# Patient Record
Sex: Female | Born: 1975 | Race: Black or African American | Hispanic: No | Marital: Married | Smoking: Former smoker
Health system: Southern US, Community
[De-identification: ages and names within clinical notes are randomized; demographics above are authoritative.]

## PROBLEM LIST (undated history)

## (undated) DIAGNOSIS — K219 Gastro-esophageal reflux disease without esophagitis: Secondary | ICD-10-CM

## (undated) DIAGNOSIS — I10 Essential (primary) hypertension: Secondary | ICD-10-CM

## (undated) DIAGNOSIS — Z8669 Personal history of other diseases of the nervous system and sense organs: Secondary | ICD-10-CM

## (undated) HISTORY — PX: WISDOM TOOTH EXTRACTION: SHX21

## (undated) HISTORY — PX: LEG SURGERY: SHX1003

---

## 2021-05-08 ENCOUNTER — Ambulatory Visit: Payer: Self-pay

## 2022-05-20 ENCOUNTER — Emergency Department (HOSPITAL_BASED_OUTPATIENT_CLINIC_OR_DEPARTMENT_OTHER): Payer: Medicaid Other

## 2022-05-20 ENCOUNTER — Other Ambulatory Visit: Payer: Self-pay

## 2022-05-20 ENCOUNTER — Encounter (HOSPITAL_BASED_OUTPATIENT_CLINIC_OR_DEPARTMENT_OTHER): Payer: Self-pay | Admitting: Emergency Medicine

## 2022-05-20 ENCOUNTER — Emergency Department (HOSPITAL_BASED_OUTPATIENT_CLINIC_OR_DEPARTMENT_OTHER)
Admission: EM | Admit: 2022-05-20 | Discharge: 2022-05-21 | Disposition: A | Discharge status: Home / Self Care | Payer: Medicaid Other | Attending: Emergency Medicine | Admitting: Emergency Medicine

## 2022-05-20 DIAGNOSIS — S8992XA Unspecified injury of left lower leg, initial encounter: Secondary | ICD-10-CM | POA: Diagnosis present

## 2022-05-20 DIAGNOSIS — S89302A Unspecified physeal fracture of lower end of left fibula, initial encounter for closed fracture: Secondary | ICD-10-CM | POA: Diagnosis not present

## 2022-05-20 DIAGNOSIS — W1830XA Fall on same level, unspecified, initial encounter: Secondary | ICD-10-CM | POA: Insufficient documentation

## 2022-05-20 DIAGNOSIS — S82832A Other fracture of upper and lower end of left fibula, initial encounter for closed fracture: Secondary | ICD-10-CM

## 2022-05-20 DIAGNOSIS — Y92009 Unspecified place in unspecified non-institutional (private) residence as the place of occurrence of the external cause: Secondary | ICD-10-CM | POA: Diagnosis not present

## 2022-05-20 DIAGNOSIS — W19XXXA Unspecified fall, initial encounter: Secondary | ICD-10-CM

## 2022-05-20 MED ORDER — LIDOCAINE 5 % EX PTCH
3.0000 | MEDICATED_PATCH | CUTANEOUS | Status: DC
  Administered 2022-05-20: 3 via TRANSDERMAL
  Filled 2022-05-20: qty 3

## 2022-05-20 MED ORDER — ACETAMINOPHEN 500 MG PO TABS
1000.0000 mg | ORAL_TABLET | Freq: Once | ORAL | Status: AC
  Administered 2022-05-20: 1000 mg via ORAL
  Filled 2022-05-20: qty 2

## 2022-05-20 MED ORDER — KETOROLAC TROMETHAMINE 60 MG/2ML IM SOLN
30.0000 mg | Freq: Once | INTRAMUSCULAR | Status: AC
  Administered 2022-05-20: 30 mg via INTRAMUSCULAR
  Filled 2022-05-20: qty 2

## 2022-05-20 NOTE — ED Triage Notes (Signed)
Pt had mechanical fall at home just pta. Now c/o pain that starts at her left knee and goes down to her ankle. Swelling present in left ankle. Also c/o right wrist pain.

## 2022-05-21 ENCOUNTER — Emergency Department (HOSPITAL_BASED_OUTPATIENT_CLINIC_OR_DEPARTMENT_OTHER): Payer: Medicaid Other

## 2022-05-21 ENCOUNTER — Encounter (HOSPITAL_BASED_OUTPATIENT_CLINIC_OR_DEPARTMENT_OTHER): Payer: Self-pay | Admitting: Emergency Medicine

## 2022-05-21 MED ORDER — TRAMADOL HCL 50 MG PO TABS: 50.0000 mg | ORAL_TABLET | Freq: Four times a day (QID) | ORAL | 0 refills | Status: DC | PRN

## 2022-05-21 MED ORDER — DICLOFENAC SODIUM ER 100 MG PO TB24: 100.0000 mg | ORAL_TABLET | Freq: Every day | ORAL | 0 refills | Status: AC

## 2022-05-21 NOTE — ED Provider Notes (Signed)
[facility] EMERGENCY DEPARTMENT Provider Note   CSN: [id number] Arrival date & time: 05/20/22  2256     History  Chief Complaint  Patient presents with   Leg Pain   Wrist Pain    Jasmine Mcdaniel is a 46 y.o. female.  The history is provided by the patient.  Leg Pain Location:  Ankle and knee Time since incident:  4 hours Injury: yes   Mechanism of injury: fall   Knee location:  L knee Pain details:    Quality:  Aching   Radiates to:  Does not radiate   Severity:  Severe   Onset quality:  Sudden   Duration:  4 hours   Timing:  Constant   Progression:  Unchanged Chronicity:  New Foreign body present:  No foreign bodies Prior injury to area:  No Relieved by:  Nothing Worsened by:  Nothing Ineffective treatments:  None tried Associated symptoms: no back pain, no fatigue, no neck pain, no numbness and no stiffness   Risk factors: no concern for non-accidental trauma   Left ankle and knee pain and Right wrist pain.  Fell onto Let knee but right hand got stuck under her.  Happened at 8. Has not taken anything.      Home Medications Prior to Admission medications   Not on File      Allergies    Patient has no known allergies.    Review of Systems   Review of Systems  Constitutional:  Negative for fatigue.  HENT:  Negative for drooling.   Eyes:  Negative for redness.  Respiratory:  Negative for wheezing and stridor.   Cardiovascular:  Negative for chest pain.  Gastrointestinal:  Negative for abdominal pain.  Musculoskeletal:  Positive for arthralgias. Negative for back pain, neck pain and stiffness.  All other systems reviewed and are negative.  Physical Exam Updated Vital Signs BP (!) 184/88   Pulse (!) 102   Temp 99.3 F (37.4 C) (Oral)   Resp 19   LMP 04/13/2022   SpO2 99%  Physical Exam Vitals and nursing note reviewed.  Constitutional:      General: She is not in acute distress.    Appearance: Normal appearance.  HENT:     Head:  Normocephalic and atraumatic.     Right Ear: Tympanic membrane normal.     Left Ear: Tympanic membrane normal.     Nose: Nose normal.  Eyes:     Conjunctiva/sclera: Conjunctivae normal.     Pupils: Pupils are equal, round, and reactive to light.  Cardiovascular:     Rate and Rhythm: Normal rate and regular rhythm.     Pulses: Normal pulses.     Heart sounds: Normal heart sounds.  Pulmonary:     Effort: Pulmonary effort is normal.     Breath sounds: Normal breath sounds.  Abdominal:     General: Bowel sounds are normal.     Palpations: Abdomen is soft.     Tenderness: There is no abdominal tenderness. There is no guarding.  Musculoskeletal:     Right wrist: Normal. No swelling, deformity, effusion, lacerations, bony tenderness, snuff box tenderness or crepitus. Normal range of motion. Normal pulse.     Left wrist: Normal. No swelling, deformity, effusion, lacerations, bony tenderness, snuff box tenderness or crepitus. Normal range of motion. Normal pulse.     Right hand: Normal. No deformity or lacerations. Normal range of motion.     Left hand: Normal. No deformity or lacerations. Normal  range of motion.     Cervical back: Normal range of motion and neck supple. No rigidity or tenderness.     Right knee: Normal.     Left knee: Normal. No LCL laxity, MCL laxity, ACL laxity or PCL laxity.Normal patellar mobility. Normal pulse.     Instability Tests: Anterior drawer test negative. Posterior drawer test negative. Anterior Lachman test negative.     Left lower leg: Normal.     Right ankle:     Right Achilles Tendon: Normal.     Left ankle: Swelling present. No ecchymosis or lacerations. Anterior drawer test negative. Normal pulse.     Left Achilles Tendon: Normal.     Left foot: Normal. Normal range of motion and normal capillary refill. No swelling, deformity, bunion, laceration, tenderness, bony tenderness or crepitus. Normal pulse.  Lymphadenopathy:     Cervical: No cervical  adenopathy.  Skin:    General: Skin is warm and dry.     Capillary Refill: Capillary refill takes less than 2 seconds.  Neurological:     General: No focal deficit present.     Mental Status: She is alert and oriented to person, place, and time.     Deep Tendon Reflexes: Reflexes normal.  Psychiatric:        Mood and Affect: Mood normal.        Behavior: Behavior normal.    ED Results / Procedures / Treatments   Labs (all labs ordered are listed, but only abnormal results are displayed) Labs Reviewed - No data to display  EKG None  Radiology DG Wrist Complete Right  Result Date: 05/20/2022 CLINICAL DATA:  Right wrist pain. EXAM: RIGHT WRIST - COMPLETE 3+ VIEW COMPARISON:  Right wrist x-ray 04/25/2022. FINDINGS: There is no evidence for acute fracture or dislocation. Old ulnar styloid avulsion fracture again seen. There is no evidence of arthropathy or other focal bone abnormality. Soft tissues are unremarkable. IMPRESSION: No acute bony abnormality. Electronically Signed   By: Darliss CheneyAmy  Guttmann M.D.   On: 05/20/2022 23:48   DG Tibia/Fibula Left Port  Result Date: 05/20/2022 CLINICAL DATA:  Fall. EXAM: PORTABLE LEFT TIBIA AND FIBULA - 2 VIEW COMPARISON:  Ankle x-ray same day FINDINGS: There is no fracture or dislocation of the mid or proximal left tibia and fibula. Please see dedicated ankle x-ray for further description. Soft tissues are within normal limits. IMPRESSION: No acute fracture or dislocation of the mid/proximal left tibia/fibula. Electronically Signed   By: Darliss CheneyAmy  Guttmann M.D.   On: 05/20/2022 23:49   DG Ankle Left Port  Result Date: 05/20/2022 CLINICAL DATA:  Fall. EXAM: PORTABLE LEFT ANKLE - 2 VIEW COMPARISON:  None Available. FINDINGS: There is an acute oblique fracture through the distal fibular diaphysis 1.9 cm proximal to the ankle mortise. There is 5 mm of distraction of the fracture site the distal portion displaced posteriorly and laterally. No other acute fractures  are seen. Joint spaces are maintained. There is soft tissue swelling surrounding the ankle. IMPRESSION: 1. Mildly displaced distal fibular fracture. Electronically Signed   By: Darliss CheneyAmy  Guttmann M.D.   On: 05/20/2022 23:48    Procedures Procedures    Medications Ordered in ED Medications  lidocaine (LIDODERM) 5 % 3 patch (3 patches Transdermal Patch Applied 05/20/22 2342)  acetaminophen (TYLENOL) tablet 1,000 mg (1,000 mg Oral Given 05/20/22 2338)  ketorolac (TORADOL) injection 30 mg (30 mg Intramuscular Given 05/20/22 2340)    ED Course/ Medical Decision Making/ A&P  Medical Decision Making Fall onto left knee with pain in Knee and ankle, wrist stuck under patient with fall, no FOOSH  Problems Addressed: Closed avulsion fracture of distal fibula, left, initial encounter: acute illness or injury    Details: boot placed crutches given call to be seen in follow up with orthopedics  Amount and/or Complexity of Data Reviewed Independent Historian: spouse    Details: see above External Data Reviewed: notes.    Details: previous notes reviewed Radiology: ordered and independent interpretation performed.    Details: negative wrist and knee Xrays,  distal fibula fracture by me  Risk OTC drugs. Prescription drug management. Risk Details: Ultram and voltaren sent to patient's pharmacy.  Referral given to orthopedics.  Wear cam walker at all times including shower.  Ice and elevation.  No driving while taking narcotics.      Final Clinical Impression(s) / ED Diagnoses Final diagnoses:  Closed avulsion fracture of distal fibula, left, initial encounter  Fall, initial encounter   Return for intractable cough, coughing up blood, fevers > 100.4 unrelieved by medication, shortness of breath, intractable vomiting, chest pain, shortness of breath, weakness, numbness, changes in speech, facial asymmetry, abdominal pain, passing out, Inability to tolerate liquids or food,  cough, altered mental status or any concerns. No signs of systemic illness or infection. The patient is nontoxic-appearing on exam and vital signs are within normal limits.  I have reviewed the triage vital signs and the nursing notes. Pertinent labs & imaging results that were available during my care of the patient were reviewed by me and considered in my medical decision making (see chart for details). After history, exam, and medical workup I feel the patient has been appropriately medically screened and is safe for discharge home. Pertinent diagnoses were discussed with the patient. Patient was given return precautions.    Rx / DC Orders ED Discharge Orders     None         Derric Dealmeida, MD 05/21/22 6767

## 2022-05-21 NOTE — Discharge Instructions (Addendum)
No driving while taking narcotic pain medication

## 2022-05-23 ENCOUNTER — Encounter: Payer: Self-pay | Admitting: Orthopaedic Surgery

## 2022-05-23 ENCOUNTER — Other Ambulatory Visit: Payer: Self-pay | Admitting: Physician Assistant

## 2022-05-23 ENCOUNTER — Ambulatory Visit (INDEPENDENT_AMBULATORY_CARE_PROVIDER_SITE_OTHER): Payer: Medicaid Other | Admitting: Orthopaedic Surgery

## 2022-05-23 DIAGNOSIS — S8262XA Displaced fracture of lateral malleolus of left fibula, initial encounter for closed fracture: Secondary | ICD-10-CM | POA: Diagnosis not present

## 2022-05-23 NOTE — Progress Notes (Signed)
Office Visit Note   Patient: Jasmine Mcdaniel           Date of Birth: 1976-08-31           MRN: [id number] Visit Date: 05/23/2022              Requested by: No referring provider defined for this encounter. PCP: System, Provider Not In   Assessment & Plan: Visit Diagnoses:  1. Closed displaced fracture of lateral malleolus of left fibula, initial encounter     Plan: Given the displacement of her left ankle lateral malleolus fracture, we have recommended open reduction/internal fixation.  I showed her ankle model and went over her x-rays and described what the surgery involves.  I did talk in detail about the risks and benefits of the surgery.  Postoperatively I would likely continue her cam walking boot and let her attend weightbearing as tolerated.  All questions and concerns were answered and addressed.  We will work on getting her set up for surgery in 2 days from now.  She should continue ice and elevation intermittently.  We will keep her overnight after surgery for antibiotics and to work with physical therapy.  We would then see her back at 2 weeks postoperative for suture removal and 3 views of her left ankle.  Follow-Up Instructions: Return for 2 weeks post-op.   Orders:  No orders of the defined types were placed in this encounter.  No orders of the defined types were placed in this encounter.     Procedures: No procedures performed   Clinical Data: No additional findings.   Subjective: Chief Complaint  Patient presents with   Left Ankle - Pain   Right Wrist - Pain  The patient is 46 years old today.  She is referred to the emergency room after mechanical fall on Sunday injuring her left ankle and her right wrist.  X-rays of the ankle show displaced lateral malleolus fracture.  X-ray of the wrist were negative.  She has been having numbness and tingling previous in that left wrist and hand.  She is in a cam walking boot and has been using crutches.  She does  report swelling and pain with her left ankle.  She is not a diabetic.  HPI  Review of Systems There is currently no listed chest pain, shortness of breath, fever, chills, nausea, vomiting  Objective: Vital Signs: There were no vitals taken for this visit.  Physical Exam She is alert and orient x3 and in no acute distress Ortho Exam Examination of the left ankle shows soft tissue swelling laterally with pain lateral and medial.  There is limited range of motion of the ankle secondary to pain and swelling.  Her wrist exam shows pain but no swelling and no instability on exam.  There is subjective numbness in the median nerve distribution. Specialty Comments:  No specialty comments available.  Imaging: No results found. X-rays of the ankle and wrist were independently reviewed.  The left ankle shows a displaced lateral malleolus fracture with some shortening.  The right wrist x-rays are negative.  PMFS History: Patient Active Problem List   Diagnosis Date Noted   Closed displaced fracture of lateral malleolus of left fibula 05/23/2022   No past medical history on file.  No family history on file.  No past surgical history on file. Social History   Occupational History   Not on file  Tobacco Use   Smoking status: Not on file   Smokeless  tobacco: Not on file  Substance and Sexual Activity   Alcohol use: Not on file   Drug use: Not on file   Sexual activity: Not on file

## 2022-05-23 NOTE — Patient Instructions (Signed)
DUE TO COVID-19 ONLY TWO VISITORS  (aged 44 and older)  IS ALLOWED TO COME WITH YOU AND STAY IN THE WAITING ROOM ONLY DURING PRE OP AND PROCEDURE.   **NO VISITORS ARE ALLOWED IN THE SHORT STAY AREA OR RECOVERY ROOM!!**  IF YOU WILL BE ADMITTED INTO THE HOSPITAL YOU ARE ALLOWED ONLY FOUR SUPPORT PEOPLE DURING VISITATION HOURS ONLY (7 AM -8PM)   The support person(s) must pass our screening, gel in and out Visitors GUEST BADGE MUST BE WORN VISIBLY  One adult visitor may remain with you overnight and MUST be in the room by 8 P.M.   You are not required to [employer] often Do NOT share personal items Notify your provider if you are in close contact with someone who has COVID or you develop fever 100.4 or greater, new onset of sneezing, cough, sore throat, shortness of breath or body aches.        Your procedure is scheduled on:  May 25, 2022    Report to [facility] Main Entrance    Report to admitting at  09:30 AM   Call this number if you have problems the morning of surgery [phone]   Do not eat food :After Midnight.   After Midnight you may have the following liquids until 08:30 AM DAY OF SURGERY  Clear Liquid Diet Water Black Coffee (sugar ok, NO MILK/CREAM OR CREAMERS)  Tea (sugar ok, NO MILK/CREAM OR CREAMERS) regular and decaf                             Plain Jell-O (NO RED)                                           Fruit ices (not with fruit pulp, NO RED)                                     Popsicles (NO RED)                                                                  Juice: apple, WHITE grape, WHITE cranberry Sports drinks like Gatorade (NO RED) Clear broth(vegetable,chicken,beef)                   The day of surgery:  Drink ONE (1) Pre-Surgery Clear Ensure at 08:30 AM the morning of surgery. Drink in one sitting. Do not sip.  This drink was given to you during your hospital  pre-op appointment visit. Nothing else to drink after  completing the Pre-Surgery Clear Ensure or G2.          If you have questions, please contact your surgeon's office.   FOLLOW ANY ADDITIONAL PRE OP INSTRUCTIONS YOU RECEIVED FROM YOUR SURGEON'S OFFICE!!!     Oral Hygiene is also important to reduce your risk of infection.  Remember - BRUSH YOUR TEETH THE MORNING OF SURGERY WITH YOUR REGULAR TOOTHPASTE   Do NOT smoke after Midnight   Take these medicines the morning of surgery with A SIP OF WATER: Tramadol                              You may not have any metal on your body including hair pins, jewelry, and body piercing             Do not wear make-up, lotions, powders, perfumes or deodorant  Do not wear nail polish including gel and S&S, artificial/acrylic nails, or any other type of covering on natural nails including finger and toenails. If you have artificial nails, gel coating, etc. that needs to be removed by a nail salon please have this removed prior to surgery or surgery may need to be canceled/ delayed if the surgeon/ anesthesia feels like they are unable to be safely monitored.   Do not shave  48 hours prior to surgery.               Do not bring valuables to the hospital. [facility] IS NOT RESPONSIBLE   FOR VALUABLES.   Contacts, dentures or bridgework may not be worn into surgery.   Bring small overnight bag day of surgery.   Special Instructions: Bring a copy of your healthcare power of attorney and living will documents the day of surgery if you haven't scanned them before.  Please read over the following fact sheets you were given: IF YOU HAVE QUESTIONS ABOUT YOUR PRE-OP INSTRUCTIONS PLEASE CALL [phone]  Jasmine Mcdaniel - Preparing for Surgery Before surgery, you can play an important role.  Because skin is not sterile, your skin needs to be as free of germs as possible.  You can reduce the number of germs on your skin by washing with CHG (chlorahexidine gluconate) soap before  surgery.  CHG is an antiseptic cleaner which kills germs and bonds with the skin to continue killing germs even after washing. Please DO NOT use if you have an allergy to CHG or antibacterial soaps.  If your skin becomes reddened/irritated stop using the CHG and inform your nurse when you arrive at Short Stay. Do not shave (including legs and underarms) for at least 48 hours prior to the first CHG shower.  You may shave your face/neck.  Please follow these instructions carefully:  1.  Shower with CHG Soap the night before surgery and the  morning of surgery.  2.  If you choose to wash your hair, wash your hair first as usual with your normal  shampoo.  3.  After you shampoo, rinse your hair and body thoroughly to remove the shampoo.                             4.  Use CHG as you would any other liquid soap.  You can apply chg directly to the skin and wash.  Gently with a scrungie or clean washcloth.  5.  Apply the CHG Soap to your body ONLY FROM THE NECK DOWN.   Do   not use on face/ open                           Wound or open sores. Avoid contact with eyes, ears mouth and   genitals (private parts).  Wash face,  Genitals (private parts) with your normal soap.             6.  Wash thoroughly, paying special attention to the area where your    surgery  will be performed.  7.  Thoroughly rinse your body with warm water from the neck down.  8.  DO NOT shower/wash with your normal soap after using and rinsing off the CHG Soap.                9.  Pat yourself dry with a clean towel.            10.  Wear clean pajamas.            11.  Place clean sheets on your bed the night of your first shower and do not  sleep with pets. Day of Surgery : Do not apply any lotions/deodorants the morning of surgery.  Please wear clean clothes to the hospital/surgery center.  FAILURE TO FOLLOW THESE INSTRUCTIONS MAY RESULT IN THE CANCELLATION OF YOUR SURGERY  PATIENT  SIGNATURE_________________________________  NURSE SIGNATURE__________________________________  ________________________________________________________________________     Jasmine Mcdaniel  An incentive spirometer is a tool that can help keep your lungs clear and active. This tool measures how well you are filling your lungs with each breath. Taking long deep breaths may help reverse or decrease the chance of developing breathing (pulmonary) problems (especially infection) following: A long period of time when you are unable to move or be active. BEFORE THE PROCEDURE  If the spirometer includes an indicator to show your best effort, your nurse or respiratory therapist will set it to a desired goal. If possible, sit up straight or lean slightly forward. Try not to slouch. Hold the incentive spirometer in an upright position. INSTRUCTIONS FOR USE  Sit on the edge of your bed if possible, or sit up as far as you can in bed or on a chair. Hold the incentive spirometer in an upright position. Breathe out normally. Place the mouthpiece in your mouth and seal your lips tightly around it. Breathe in slowly and as deeply as possible, raising the piston or the ball toward the top of the column. Hold your breath for 3-5 seconds or for as long as possible. Allow the piston or ball to fall to the bottom of the column. Remove the mouthpiece from your mouth and breathe out normally. Rest for a few seconds and repeat Steps 1 through 7 at least 10 times every 1-2 hours when you are awake. Take your time and take a few normal breaths between deep breaths. The spirometer may include an indicator to show your best effort. Use the indicator as a goal to work toward during each repetition. After each set of 10 deep breaths, practice coughing to be sure your lungs are clear. If you have an incision (the cut made at the time of surgery), support your incision when coughing by placing a pillow or rolled up  towels firmly against it. Once you are able to get out of bed, walk around indoors and cough well. You may stop using the incentive spirometer when instructed by your caregiver.  RISKS AND COMPLICATIONS Take your time so you do not get dizzy or light-headed. If you are in pain, you may need to take or ask for pain medication before doing incentive spirometry. It is harder to take a deep breath if you are having pain. AFTER USE Rest and breathe slowly and easily. It can  be helpful to keep track of a log of your progress. Your caregiver can provide you with a simple table to help with this. If you are using the spirometer at home, follow these instructions: [place] IF:  You are having difficultly using the spirometer. You have trouble using the spirometer as often as instructed. Your pain medication is not giving enough relief while using the spirometer. You develop fever of 100.5 F (38.1 C) or higher. SEEK IMMEDIATE MEDICAL CARE IF:  You cough up bloody sputum that had not been present before. You develop fever of 102 F (38.9 C) or greater. You develop worsening pain at or near the incision site. MAKE SURE YOU:  Understand these instructions. Will watch your condition. Will get help right away if you are not doing well or get worse. Document Released: 04/22/2007 Document Revised: 03/03/2012 Document Reviewed: 06/23/2007 [facility] Patient Information 2014 [place], [place].   ________________________________________________________________________

## 2022-05-24 ENCOUNTER — Encounter (HOSPITAL_COMMUNITY): Payer: Self-pay | Admitting: *Deleted

## 2022-05-24 ENCOUNTER — Encounter (HOSPITAL_COMMUNITY)
Admission: RE | Admit: 2022-05-24 | Discharge: 2022-05-24 | Disposition: A | Discharge status: Home / Self Care | Payer: Medicaid Other | Source: Ambulatory Visit | Attending: Orthopaedic Surgery | Admitting: Orthopaedic Surgery

## 2022-05-24 ENCOUNTER — Other Ambulatory Visit: Payer: Self-pay

## 2022-05-24 ENCOUNTER — Encounter (HOSPITAL_COMMUNITY): Payer: Self-pay | Admitting: Orthopaedic Surgery

## 2022-05-24 VITALS — BP 143/93 | HR 99 | Temp 98.9°F | Resp 18 | Ht 63.25 in | Wt 235.0 lb

## 2022-05-24 DIAGNOSIS — I2510 Atherosclerotic heart disease of native coronary artery without angina pectoris: Secondary | ICD-10-CM | POA: Insufficient documentation

## 2022-05-24 DIAGNOSIS — Z01818 Encounter for other preprocedural examination: Secondary | ICD-10-CM | POA: Insufficient documentation

## 2022-05-24 HISTORY — DX: Gastro-esophageal reflux disease without esophagitis: K21.9

## 2022-05-24 HISTORY — DX: Personal history of other diseases of the nervous system and sense organs: Z86.69

## 2022-05-24 HISTORY — DX: Essential (primary) hypertension: I10

## 2022-05-24 LAB — BASIC METABOLIC PANEL
Anion gap: 6 (ref 5–15)
BUN: 9 mg/dL (ref 6–20)
CO2: 27 mmol/L (ref 22–32)
Calcium: 8.8 mg/dL — ABNORMAL LOW (ref 8.9–10.3)
Chloride: 105 mmol/L (ref 98–111)
Creatinine, Ser: 0.72 mg/dL (ref 0.44–1.00)
GFR, Estimated: >60 mL/min (ref >60)
Glucose, Bld: 113 mg/dL — ABNORMAL HIGH (ref 70–99)
Potassium: 4.2 mmol/L (ref 3.5–5.1)
Sodium: 138 mmol/L (ref 135–145)

## 2022-05-24 LAB — SURGICAL PCR SCREEN
MRSA, PCR: NEGATIVE
Staphylococcus aureus: NEGATIVE

## 2022-05-24 NOTE — H&P (Signed)
Jasmine Mcdaniel is an 46 y.o. female.   Chief Complaint:   Left ankle pain and swelling with known fracture HPI: The patient is a 46 year old female who had a mechanical fall on Sunday of this week.  She injured her left ankle.  She was seen at one of the [facility] facilities and found to have a displaced lateral malleolus fracture of the left ankle.  The x-rays show shortening and displacement the fracture and this is an unstable left lateral malleolus fracture of the ankle.  Surgery has been recommended to treat this injury.  Past Medical History:  Diagnosis Date   GERD (gastroesophageal reflux disease)    History of migraine headaches    Hypertension     Past Surgical History:  Procedure Laterality Date   LEG SURGERY Left    WISDOM TOOTH EXTRACTION      History reviewed. No pertinent family history. Social History:  reports that she has quit smoking. Her smoking use included cigarettes. She has a 1.50 pack-year smoking history. She has never used smokeless tobacco. She reports current alcohol use. She reports that she does not use drugs.  Allergies:  Allergies  Allergen Reactions   Bee Pollen Anaphylaxis    No medications prior to admission.    Results for orders placed or performed during the hospital encounter of 05/24/22 (from the past 48 hour(s))  Surgical pcr screen     Status: None   Collection Time: 05/24/22  8:45 AM   Specimen: Nasal Mucosa; Nasal Swab  Result Value Ref Range   MRSA, PCR NEGATIVE NEGATIVE   Staphylococcus aureus NEGATIVE NEGATIVE    Comment: (NOTE) The Xpert SA Assay (FDA approved for NASAL specimens in patients 35 years of age and older), is one component of a comprehensive surveillance program. It is not intended to diagnose infection nor to guide or monitor treatment. Performed at [facility], [place] [address] [place], [place] [id number]   Basic metabolic panel     Status: Abnormal   Collection Time: 05/24/22  8:45 AM  Result  Value Ref Range   Sodium 138 135 - 145 mmol/L   Potassium 4.2 3.5 - 5.1 mmol/L   Chloride 105 98 - 111 mmol/L   CO2 27 22 - 32 mmol/L   Glucose, Bld 113 (H) 70 - 99 mg/dL    Comment: Glucose reference range applies only to samples taken after fasting for at least 8 hours.   BUN 9 6 - 20 mg/dL   Creatinine, Ser 0.72 0.44 - 1.00 mg/dL   Calcium 8.8 (L) 8.9 - 10.3 mg/dL   GFR, Estimated >60 >60 mL/min    Comment: (NOTE) Calculated using the CKD-EPI Creatinine Equation (2021)    Anion gap 6 5 - 15    Comment: Performed at [facility], [place] [address] [place], [place] [address]   No results found.  Review of Systems  All other systems reviewed and are negative.  Last menstrual period 05/12/2022. Physical Exam Vitals reviewed.  Constitutional:      Appearance: Normal appearance.  HENT:     Head: Normocephalic and atraumatic.  Eyes:     Extraocular Movements: Extraocular movements intact.     Pupils: Pupils are equal, round, and reactive to light.  Cardiovascular:     Rate and Rhythm: Normal rate and regular rhythm.     Pulses: Normal pulses.  Pulmonary:     Effort: Pulmonary effort is normal.     Breath sounds: Normal breath  sounds.  Abdominal:     Palpations: Abdomen is soft.  Musculoskeletal:     Cervical back: Normal range of motion and neck supple.     Left ankle: Swelling present. Tenderness present over the lateral malleolus. Decreased range of motion.  Neurological:     Mental Status: She is alert and oriented to person, place, and time.  Psychiatric:        Behavior: Behavior normal.     Assessment/Plan Displaced left ankle lateral malleolus fracture  The plan is to proceed to surgery for open reduction/internal fixation of the left ankle lateral malleolus to stabilize the fracture.  The risk and benefits of surgery been described in detail.  Mcarthur Rossetti, MD 05/24/2022, 7:39 PM

## 2022-05-24 NOTE — Progress Notes (Addendum)
COVID Vaccine Completed:  Yes  Date of COVID positive in last 90 days:  No  PCP - Vashti Hey, PA Cardiologist - N/A  Chest x-ray - N/A EKG - 05-24-22 Epic  Stress Test - N/A ECHO - N/A Cardiac Cath - N/A Pacemaker/ICD device last checked: Spinal Cord Stimulator:N/A Heart monitor - neg results per patient  Bowel Prep - N/A  Sleep Study - Yes, neg sleep apnea CPAP - No  Fasting Blood Sugar - N/A Checks Blood Sugar _____ times a day  Blood Thinner Instructions:  N/A Aspirin Instructions: Last Dose:  Activity level:   Can go up a flight of stairs and perform activities of daily living without stopping and without symptoms of chest pain or shortness of breath.  Current limitations due to ankle fracture    Anesthesia review: N/A  Patient denies shortness of breath, fever, cough and chest pain at PAT appointment  Patient verbalized understanding of instructions that were given to them at the PAT appointment. Patient was also instructed that they will need to review over the PAT instructions again at home before surgery.

## 2022-05-24 NOTE — Patient Instructions (Addendum)
DUE TO COVID-19 ONLY TWO VISITORS  (aged 44 and older)  IS ALLOWED TO COME WITH YOU AND STAY IN THE WAITING ROOM ONLY DURING PRE OP AND PROCEDURE.   **NO VISITORS ARE ALLOWED IN THE SHORT STAY AREA OR RECOVERY ROOM!!**  IF YOU WILL BE ADMITTED INTO THE HOSPITAL YOU ARE ALLOWED ONLY FOUR SUPPORT PEOPLE DURING VISITATION HOURS ONLY (7 AM -8PM)   The support person(s) must pass our screening, gel in and out Visitors GUEST BADGE MUST BE WORN VISIBLY  One adult visitor may remain with you overnight and MUST be in the room by 8 P.M.   You are not required to [employer] often Do NOT share personal items Notify your provider if you are in close contact with someone who has COVID or you develop fever 100.4 or greater, new onset of sneezing, cough, sore throat, shortness of breath or body aches.        Your procedure is scheduled on:  May 25, 2022    Report to [facility] Main Entrance    Report to admitting at  10:45   Call this number if you have problems the morning of surgery [phone]   Do not eat food :After Midnight.   After Midnight you may have the following liquids until 10:00 AM DAY OF SURGERY  Clear Liquid Diet Water Black Coffee (sugar ok, NO MILK/CREAM OR CREAMERS)  Tea (sugar ok, NO MILK/CREAM OR CREAMERS) regular and decaf                             Plain Jell-O (NO RED)                                           Fruit ices (not with fruit pulp, NO RED)                                     Popsicles (NO RED)                                                                  Juice: apple, WHITE grape, WHITE cranberry Sports drinks like Gatorade (NO RED) Clear broth(vegetable,chicken,beef)                  The day of surgery:  Drink ONE (1) Pre-Surgery Clear Ensure at 10:00 AM the morning of surgery. Drink in one sitting. Do not sip.  This drink was given to you during your hospital  pre-op appointment visit. Nothing else to drink after completing  the Pre-Surgery Clear Ensure          If you have questions, please contact your surgeon's office.   FOLLOW ANY ADDITIONAL PRE OP INSTRUCTIONS YOU RECEIVED FROM YOUR SURGEON'S OFFICE!!!     Oral Hygiene is also important to reduce your risk of infection.  Remember - BRUSH YOUR TEETH THE MORNING OF SURGERY WITH YOUR REGULAR TOOTHPASTE   Do NOT smoke after Midnight   Take these medicines the morning of surgery with A SIP OF WATER: Tramadol if needed.  Amlodipine, nasal spray                              You may not have any metal on your body including hair pins, jewelry, and body piercing             Do not wear make-up, lotions, powders, perfumes or deodorant  Do not wear nail polish including gel and S&S, artificial/acrylic nails, or any other type of covering on natural nails including finger and toenails. If you have artificial nails, gel coating, etc. that needs to be removed by a nail salon please have this removed prior to surgery or surgery may need to be canceled/ delayed if the surgeon/ anesthesia feels like they are unable to be safely monitored.   Do not shave  48 hours prior to surgery.               Do not bring valuables to the hospital. [facility] IS NOT RESPONSIBLE   FOR VALUABLES.   Contacts, dentures or bridgework may not be worn into surgery.   Bring small overnight bag day of surgery.   Please read over the following fact sheets you were given: IF YOU HAVE QUESTIONS ABOUT YOUR PRE-OP INSTRUCTIONS PLEASE CALL [phone] [facility] - Preparing for Surgery Before surgery, you can play an important role.  Because skin is not sterile, your skin needs to be as free of germs as possible.  You can reduce the number of germs on your skin by washing with CHG (chlorahexidine gluconate) soap before surgery.  CHG is an antiseptic cleaner which kills germs and bonds with the skin to continue killing germs even after washing. Please  DO NOT use if you have an allergy to CHG or antibacterial soaps.  If your skin becomes reddened/irritated stop using the CHG and inform your nurse when you arrive at Short Stay. Do not shave (including legs and underarms) for at least 48 hours prior to the first CHG shower.  You may shave your face/neck.  Please follow these instructions carefully:  1.  Shower with CHG Soap the night before surgery and the  morning of surgery.  2.  If you choose to wash your hair, wash your hair first as usual with your normal  shampoo.  3.  After you shampoo, rinse your hair and body thoroughly to remove the shampoo.                             4.  Use CHG as you would any other liquid soap.  You can apply chg directly to the skin and wash.  Gently with a scrungie or clean washcloth.  5.  Apply the CHG Soap to your body ONLY FROM THE NECK DOWN.   Do   not use on face/ open                           Wound or open sores. Avoid contact with eyes, ears mouth and   genitals (private parts).  Wash face,  Genitals (private parts) with your normal soap.             6.  Wash thoroughly, paying special attention to the area where your    surgery  will be performed.  7.  Thoroughly rinse your body with warm water from the neck down.  8.  DO NOT shower/wash with your normal soap after using and rinsing off the CHG Soap.                9.  Pat yourself dry with a clean towel.            10.  Wear clean pajamas.            11.  Place clean sheets on your bed the night of your first shower and do not  sleep with pets. Day of Surgery : Do not apply any lotions/deodorants the morning of surgery.  Please wear clean clothes to the hospital/surgery center.  FAILURE TO FOLLOW THESE INSTRUCTIONS MAY RESULT IN THE CANCELLATION OF YOUR SURGERY  PATIENT SIGNATURE_________________________________  NURSE  SIGNATURE__________________________________  ________________________________________________________________________     Jasmine Mcdaniel  An incentive spirometer is a tool that can help keep your lungs clear and active. This tool measures how well you are filling your lungs with each breath. Taking long deep breaths may help reverse or decrease the chance of developing breathing (pulmonary) problems (especially infection) following: A long period of time when you are unable to move or be active. BEFORE THE PROCEDURE  If the spirometer includes an indicator to show your best effort, your nurse or respiratory therapist will set it to a desired goal. If possible, sit up straight or lean slightly forward. Try not to slouch. Hold the incentive spirometer in an upright position. INSTRUCTIONS FOR USE  Sit on the edge of your bed if possible, or sit up as far as you can in bed or on a chair. Hold the incentive spirometer in an upright position. Breathe out normally. Place the mouthpiece in your mouth and seal your lips tightly around it. Breathe in slowly and as deeply as possible, raising the piston or the ball toward the top of the column. Hold your breath for 3-5 seconds or for as long as possible. Allow the piston or ball to fall to the bottom of the column. Remove the mouthpiece from your mouth and breathe out normally. Rest for a few seconds and repeat Steps 1 through 7 at least 10 times every 1-2 hours when you are awake. Take your time and take a few normal breaths between deep breaths. The spirometer may include an indicator to show your best effort. Use the indicator as a goal to work toward during each repetition. After each set of 10 deep breaths, practice coughing to be sure your lungs are clear. If you have an incision (the cut made at the time of surgery), support your incision when coughing by placing a pillow or rolled up towels firmly against it. Once you are able to get out  of bed, walk around indoors and cough well. You may stop using the incentive spirometer when instructed by your caregiver.  RISKS AND COMPLICATIONS Take your time so you do not get dizzy or light-headed. If you are in pain, you may need to take or ask for pain medication before doing incentive spirometry. It is harder to take a deep breath if you are having pain. AFTER USE Rest and breathe slowly and easily. It can  be helpful to keep track of a log of your progress. Your caregiver can provide you with a simple table to help with this. If you are using the spirometer at home, follow these instructions: [place] IF:  You are having difficultly using the spirometer. You have trouble using the spirometer as often as instructed. Your pain medication is not giving enough relief while using the spirometer. You develop fever of 100.5 F (38.1 C) or higher. SEEK IMMEDIATE MEDICAL CARE IF:  You cough up bloody sputum that had not been present before. You develop fever of 102 F (38.9 C) or greater. You develop worsening pain at or near the incision site. MAKE SURE YOU:  Understand these instructions. Will watch your condition. Will get help right away if you are not doing well or get worse. Document Released: 04/22/2007 Document Revised: 03/03/2012 Document Reviewed: 06/23/2007 [facility] Patient Information 2014 [place], [place].   ________________________________________________________________________

## 2022-05-25 ENCOUNTER — Ambulatory Visit (HOSPITAL_BASED_OUTPATIENT_CLINIC_OR_DEPARTMENT_OTHER): Payer: Medicaid Other | Admitting: Anesthesiology

## 2022-05-25 ENCOUNTER — Encounter (HOSPITAL_COMMUNITY): Payer: Self-pay | Admitting: Orthopaedic Surgery

## 2022-05-25 ENCOUNTER — Other Ambulatory Visit: Payer: Self-pay

## 2022-05-25 ENCOUNTER — Observation Stay (HOSPITAL_COMMUNITY)
Admission: RE | Admit: 2022-05-25 | Discharge: 2022-05-26 | Disposition: A | Discharge status: Home / Self Care | Payer: Medicaid Other | Attending: Orthopaedic Surgery | Admitting: Orthopaedic Surgery

## 2022-05-25 ENCOUNTER — Encounter (HOSPITAL_COMMUNITY)
Admission: RE | Disposition: A | Discharge status: Home / Self Care | Payer: Self-pay | Source: Home / Self Care | Attending: Orthopaedic Surgery

## 2022-05-25 ENCOUNTER — Ambulatory Visit (HOSPITAL_COMMUNITY): Payer: Medicaid Other | Admitting: Anesthesiology

## 2022-05-25 ENCOUNTER — Ambulatory Visit (HOSPITAL_COMMUNITY): Payer: Medicaid Other

## 2022-05-25 DIAGNOSIS — S8262XA Displaced fracture of lateral malleolus of left fibula, initial encounter for closed fracture: Secondary | ICD-10-CM | POA: Diagnosis not present

## 2022-05-25 DIAGNOSIS — S82892A Other fracture of left lower leg, initial encounter for closed fracture: Secondary | ICD-10-CM | POA: Diagnosis present

## 2022-05-25 DIAGNOSIS — W010XXA Fall on same level from slipping, tripping and stumbling without subsequent striking against object, initial encounter: Secondary | ICD-10-CM | POA: Insufficient documentation

## 2022-05-25 DIAGNOSIS — I10 Essential (primary) hypertension: Secondary | ICD-10-CM | POA: Insufficient documentation

## 2022-05-25 DIAGNOSIS — Z01818 Encounter for other preprocedural examination: Secondary | ICD-10-CM

## 2022-05-25 DIAGNOSIS — Z87891 Personal history of nicotine dependence: Secondary | ICD-10-CM | POA: Insufficient documentation

## 2022-05-25 HISTORY — PX: ORIF ANKLE FRACTURE: SHX5408

## 2022-05-25 LAB — POCT PREGNANCY, URINE: Preg Test, Ur: NEGATIVE

## 2022-05-25 SURGERY — OPEN REDUCTION INTERNAL FIXATION (ORIF) ANKLE FRACTURE
Anesthesia: General | Site: Ankle | Laterality: Left

## 2022-05-25 MED ORDER — METHOCARBAMOL 500 MG PO TABS
500.0000 mg | ORAL_TABLET | Freq: Four times a day (QID) | ORAL | Status: DC | PRN
  Administered 2022-05-25: 500 mg via ORAL
  Filled 2022-05-25: qty 1

## 2022-05-25 MED ORDER — ORAL CARE MOUTH RINSE: 15.0000 mL | Freq: Once | OROMUCOSAL | Status: AC

## 2022-05-25 MED ORDER — ACETAMINOPHEN 325 MG PO TABS: 325.0000 mg | ORAL_TABLET | Freq: Four times a day (QID) | ORAL | Status: DC | PRN

## 2022-05-25 MED ORDER — OXYCODONE HCL 5 MG PO TABS
10.0000 mg | ORAL_TABLET | ORAL | Status: DC | PRN
  Administered 2022-05-26: 10 mg via ORAL
  Administered 2022-05-26: 15 mg via ORAL
  Administered 2022-05-26: 10 mg via ORAL
  Filled 2022-05-25 (×2): qty 2
  Filled 2022-05-25: qty 3

## 2022-05-25 MED ORDER — ROPIVACAINE HCL 5 MG/ML IJ SOLN
INTRAMUSCULAR | Status: DC | PRN
  Administered 2022-05-25: 30 mL via PERINEURAL

## 2022-05-25 MED ORDER — AMLODIPINE BESYLATE 5 MG PO TABS: 5.0000 mg | ORAL_TABLET | Freq: Every day | ORAL | Status: DC

## 2022-05-25 MED ORDER — PROPOFOL 10 MG/ML IV BOLUS
INTRAVENOUS | Status: DC | PRN
  Administered 2022-05-25: 200 mg via INTRAVENOUS
  Administered 2022-05-25: 20 mg via INTRAVENOUS

## 2022-05-25 MED ORDER — OXYCODONE HCL 5 MG/5ML PO SOLN: 5.0000 mg | Freq: Once | ORAL | Status: DC | PRN

## 2022-05-25 MED ORDER — FENTANYL CITRATE (PF) 100 MCG/2ML IJ SOLN
INTRAMUSCULAR | Status: AC
  Filled 2022-05-25: qty 2

## 2022-05-25 MED ORDER — ONDANSETRON HCL 4 MG/2ML IJ SOLN: 4.0000 mg | Freq: Once | INTRAMUSCULAR | Status: DC | PRN

## 2022-05-25 MED ORDER — HYDROMORPHONE HCL 1 MG/ML IJ SOLN: 0.2500 mg | INTRAMUSCULAR | Status: DC | PRN

## 2022-05-25 MED ORDER — HYDROMORPHONE HCL 1 MG/ML IJ SOLN
0.5000 mg | INTRAMUSCULAR | Status: DC | PRN
  Administered 2022-05-25 – 2022-05-26 (×2): 1 mg via INTRAVENOUS
  Filled 2022-05-25 (×2): qty 1

## 2022-05-25 MED ORDER — OXYCODONE HCL 5 MG PO TABS: 5.0000 mg | ORAL_TABLET | Freq: Once | ORAL | Status: DC | PRN

## 2022-05-25 MED ORDER — OXYCODONE HCL 5 MG PO TABS
5.0000 mg | ORAL_TABLET | ORAL | Status: DC | PRN
  Administered 2022-05-25: 10 mg via ORAL
  Filled 2022-05-25: qty 2

## 2022-05-25 MED ORDER — SODIUM CHLORIDE 0.9 % IV SOLN: INTRAVENOUS | Status: DC

## 2022-05-25 MED ORDER — CHLORHEXIDINE GLUCONATE 0.12 % MT SOLN
15.0000 mL | Freq: Once | OROMUCOSAL | Status: AC
  Administered 2022-05-25: 15 mL via OROMUCOSAL

## 2022-05-25 MED ORDER — DOCUSATE SODIUM 100 MG PO CAPS
100.0000 mg | ORAL_CAPSULE | Freq: Two times a day (BID) | ORAL | Status: DC
  Administered 2022-05-25 – 2022-05-26 (×2): 100 mg via ORAL
  Filled 2022-05-25 (×2): qty 1

## 2022-05-25 MED ORDER — KETOROLAC TROMETHAMINE 30 MG/ML IJ SOLN: 30.0000 mg | Freq: Once | INTRAMUSCULAR | Status: DC | PRN

## 2022-05-25 MED ORDER — KETOROLAC TROMETHAMINE 30 MG/ML IJ SOLN
INTRAMUSCULAR | Status: DC | PRN
  Administered 2022-05-25: 30 mg via INTRAVENOUS

## 2022-05-25 MED ORDER — DIPHENHYDRAMINE HCL 12.5 MG/5ML PO ELIX: 12.5000 mg | ORAL_SOLUTION | ORAL | Status: DC | PRN

## 2022-05-25 MED ORDER — LIDOCAINE HCL (PF) 2 % IJ SOLN
INTRAMUSCULAR | Status: AC
  Filled 2022-05-25: qty 5

## 2022-05-25 MED ORDER — ONDANSETRON HCL 4 MG/2ML IJ SOLN: 4.0000 mg | Freq: Four times a day (QID) | INTRAMUSCULAR | Status: DC | PRN

## 2022-05-25 MED ORDER — ONDANSETRON HCL 4 MG/2ML IJ SOLN
INTRAMUSCULAR | Status: DC | PRN
  Administered 2022-05-25: 4 mg via INTRAVENOUS

## 2022-05-25 MED ORDER — CEFAZOLIN SODIUM-DEXTROSE 2-4 GM/100ML-% IV SOLN
2.0000 g | INTRAVENOUS | Status: AC
  Administered 2022-05-25: 2 g via INTRAVENOUS
  Filled 2022-05-25: qty 100

## 2022-05-25 MED ORDER — 0.9 % SODIUM CHLORIDE (POUR BTL) OPTIME
TOPICAL | Status: DC | PRN
  Administered 2022-05-25: 1000 mL

## 2022-05-25 MED ORDER — DEXAMETHASONE SODIUM PHOSPHATE 10 MG/ML IJ SOLN
INTRAMUSCULAR | Status: DC | PRN
  Administered 2022-05-25: 8 mg via INTRAVENOUS

## 2022-05-25 MED ORDER — LIDOCAINE-EPINEPHRINE 2 %-1:100000 IJ SOLN
INTRAMUSCULAR | Status: DC | PRN
  Administered 2022-05-25: 15 mL via PERINEURAL

## 2022-05-25 MED ORDER — METHOCARBAMOL 500 MG IVPB - SIMPLE MED: 500.0000 mg | Freq: Four times a day (QID) | INTRAVENOUS | Status: DC | PRN

## 2022-05-25 MED ORDER — DEXAMETHASONE SODIUM PHOSPHATE 10 MG/ML IJ SOLN
INTRAMUSCULAR | Status: AC
  Filled 2022-05-25: qty 1

## 2022-05-25 MED ORDER — CEFAZOLIN SODIUM-DEXTROSE 1-4 GM/50ML-% IV SOLN
1.0000 g | Freq: Four times a day (QID) | INTRAVENOUS | Status: AC
  Administered 2022-05-25 – 2022-05-26 (×3): 1 g via INTRAVENOUS
  Filled 2022-05-25 (×3): qty 50

## 2022-05-25 MED ORDER — AMLODIPINE BESYLATE 5 MG PO TABS
5.0000 mg | ORAL_TABLET | Freq: Every day | ORAL | Status: DC
  Administered 2022-05-25 – 2022-05-26 (×2): 5 mg via ORAL
  Filled 2022-05-25 (×2): qty 1

## 2022-05-25 MED ORDER — PROPOFOL 500 MG/50ML IV EMUL
INTRAVENOUS | Status: AC
  Filled 2022-05-25: qty 50

## 2022-05-25 MED ORDER — MIDAZOLAM HCL 2 MG/2ML IJ SOLN
1.0000 mg | INTRAMUSCULAR | Status: DC
  Administered 2022-05-25: 2 mg via INTRAVENOUS
  Filled 2022-05-25: qty 2

## 2022-05-25 MED ORDER — LIDOCAINE 2% (20 MG/ML) 5 ML SYRINGE
INTRAMUSCULAR | Status: DC | PRN
  Administered 2022-05-25: 100 mg via INTRAVENOUS

## 2022-05-25 MED ORDER — BUPIVACAINE HCL (PF) 0.5 % IJ SOLN
INTRAMUSCULAR | Status: AC
  Filled 2022-05-25: qty 30

## 2022-05-25 MED ORDER — FENTANYL CITRATE PF 50 MCG/ML IJ SOSY
50.0000 ug | PREFILLED_SYRINGE | INTRAMUSCULAR | Status: DC
  Administered 2022-05-25: 100 ug via INTRAVENOUS
  Filled 2022-05-25: qty 2

## 2022-05-25 MED ORDER — PROPOFOL 10 MG/ML IV BOLUS
INTRAVENOUS | Status: AC
  Filled 2022-05-25: qty 20

## 2022-05-25 MED ORDER — FENTANYL CITRATE (PF) 100 MCG/2ML IJ SOLN
INTRAMUSCULAR | Status: DC | PRN
  Administered 2022-05-25: 50 ug via INTRAVENOUS

## 2022-05-25 MED ORDER — METOCLOPRAMIDE HCL 5 MG/ML IJ SOLN: 5.0000 mg | Freq: Three times a day (TID) | INTRAMUSCULAR | Status: DC | PRN

## 2022-05-25 MED ORDER — BUPIVACAINE-EPINEPHRINE (PF) 0.5% -1:200000 IJ SOLN
INTRAMUSCULAR | Status: AC
Start: 2022-05-25 — End: ?
  Filled 2022-05-25: qty 30

## 2022-05-25 MED ORDER — ONDANSETRON HCL 4 MG PO TABS: 4.0000 mg | ORAL_TABLET | Freq: Four times a day (QID) | ORAL | Status: DC | PRN

## 2022-05-25 MED ORDER — BUPIVACAINE HCL (PF) 0.5 % IJ SOLN: INTRAMUSCULAR | Status: DC | PRN

## 2022-05-25 MED ORDER — LACTATED RINGERS IV SOLN
INTRAVENOUS | Status: DC
Start: 2022-05-25 — End: 2022-05-25

## 2022-05-25 MED ORDER — ONDANSETRON HCL 4 MG/2ML IJ SOLN
INTRAMUSCULAR | Status: AC
  Filled 2022-05-25: qty 2

## 2022-05-25 MED ORDER — METOCLOPRAMIDE HCL 5 MG PO TABS: 5.0000 mg | ORAL_TABLET | Freq: Three times a day (TID) | ORAL | Status: DC | PRN

## 2022-05-25 SURGICAL SUPPLY — 42 items
BAG COUNTER SPONGE SURGICOUNT (BAG) ×1 IMPLANT
BAG SPNG CNTER NS LX DISP (BAG) ×1
BIT DRILL 2.6 (BIT) ×2
BIT DRILL 2.6X VARIAX 2 (BIT) IMPLANT
BIT DRL 2.6X VARIAX 2 (BIT) ×1
BNDG ELASTIC 4X5.8 VLCR STR LF (GAUZE/BANDAGES/DRESSINGS) ×1 IMPLANT
BNDG ELASTIC 6X5.8 VLCR STR LF (GAUZE/BANDAGES/DRESSINGS) ×1 IMPLANT
CLOTH BEACON ORANGE TIMEOUT ST (SAFETY) ×2 IMPLANT
COVER SURGICAL LIGHT HANDLE (MISCELLANEOUS) ×2 IMPLANT
CUFF TOURN SGL QUICK 34 (TOURNIQUET CUFF) ×2
CUFF TRNQT CYL 34X4.125X (TOURNIQUET CUFF) ×1 IMPLANT
DRAPE C-ARM 42X120 X-RAY (DRAPES) ×2 IMPLANT
DRAPE C-ARMOR (DRAPES) ×2 IMPLANT
DRAPE U-SHAPE 47X51 STRL (DRAPES) ×2 IMPLANT
DRSG ADAPTIC 3X8 NADH LF (GAUZE/BANDAGES/DRESSINGS) ×2 IMPLANT
DRSG PAD ABDOMINAL 8X10 ST (GAUZE/BANDAGES/DRESSINGS) ×2 IMPLANT
DURAPREP 26ML APPLICATOR (WOUND CARE) ×2 IMPLANT
ELECT REM PT RETURN 15FT ADLT (MISCELLANEOUS) ×2 IMPLANT
GAUZE SPONGE 4X4 12PLY STRL (GAUZE/BANDAGES/DRESSINGS) ×1 IMPLANT
GLOVE BIO SURGEON STRL SZ7.5 (GLOVE) ×2 IMPLANT
GLOVE BIOGEL PI IND STRL 8 (GLOVE) ×2 IMPLANT
GLOVE BIOGEL PI INDICATOR 8 (GLOVE) ×2
GLOVE ECLIPSE 8.0 STRL XLNG CF (GLOVE) ×2 IMPLANT
GOWN STRL REUS W/ TWL XL LVL3 (GOWN DISPOSABLE) ×2 IMPLANT
GOWN STRL REUS W/TWL XL LVL3 (GOWN DISPOSABLE) ×4
KIT BASIN OR (CUSTOM PROCEDURE TRAY) ×2 IMPLANT
KIT TURNOVER KIT A (KITS) ×1 IMPLANT
PACK ORTHO EXTREMITY (CUSTOM PROCEDURE TRAY) ×2 IMPLANT
PAD CAST 4YDX4 CTTN HI CHSV (CAST SUPPLIES) IMPLANT
PADDING CAST COTTON 4X4 STRL (CAST SUPPLIES) ×4
PLATE BONE 1/3 TUBULAR 59 5H (Plate) ×1 IMPLANT
PROTECTOR NERVE ULNAR (MISCELLANEOUS) ×2 IMPLANT
SCREW BONE THRD T10 3.5X12 (Screw) ×4 IMPLANT
SCREW LOCK HEX T10 3.5X12 (Screw) ×1 IMPLANT
SPIKE FLUID TRANSFER (MISCELLANEOUS) ×2 IMPLANT
SUT ETHILON 2 0 PS N (SUTURE) ×2 IMPLANT
SUT VIC AB 0 CT1 36 (SUTURE) ×2 IMPLANT
SUT VIC AB 2-0 CT1 27 (SUTURE) ×2
SUT VIC AB 2-0 CT1 TAPERPNT 27 (SUTURE) ×1 IMPLANT
SYR CONTROL 10ML LL (SYRINGE) ×2 IMPLANT
TOWEL OR 17X26 10 PK STRL BLUE (TOWEL DISPOSABLE) ×4 IMPLANT
TOWEL OR NON WOVEN STRL DISP B (DISPOSABLE) ×2 IMPLANT

## 2022-05-25 NOTE — Anesthesia Procedure Notes (Signed)
Anesthesia Regional Block: Popliteal block   Pre-Anesthetic Checklist: , timeout performed,  Correct Patient, Correct Site, Correct Laterality,  Correct Procedure, Correct Position, site marked,  Risks and benefits discussed,  Surgical consent,  Pre-op evaluation,  At surgeon's request and post-op pain management  Laterality: Left  Prep: chloraprep       Needles:  Injection technique: Single-shot  Needle Type: Echogenic Needle     Needle Length: 9cm      Additional Needles:   Procedures:,,,, ultrasound used (permanent image in chart),,    Narrative:  Start time: 05/25/2022 10:54 AM End time: 05/25/2022 11:01 AM Injection made incrementally with aspirations every 5 mL.  Performed by: Personally  Anesthesiologist: Eilene Ghazi, MD  Additional Notes: Patient tolerated the procedure well without complications

## 2022-05-25 NOTE — Brief Op Note (Signed)
05/25/2022  12:23 PM  PATIENT:  Jasmine Mcdaniel  46 y.o. female  PRE-OPERATIVE DIAGNOSIS:  displaced left lateral malleolus fracture  POST-OPERATIVE DIAGNOSIS:  displaced left lateral malleolus fracture  PROCEDURE:  Procedure(s): OPEN REDUCTION INTERNAL FIXATION (ORIF) LEFT LATERAL MALLEOLUS ANKLE FRACTURE (Left)  SURGEON:  Surgeon(s) and Role:    Kathryne Hitch, MD - Primary  PHYSICIAN ASSISTANT:  Rexene Edison, PA-C  ANESTHESIA:   regional and general  COUNTS:  YES  TOURNIQUET:  * Missing tourniquet times found for documented tourniquets in log: [id number] *  DICTATION: .Other Dictation: Dictation Number [id number]  PLAN OF CARE: Admit for overnight observation  PATIENT DISPOSITION:  PACU - hemodynamically stable.   Delay start of Pharmacological VTE agent (>24hrs) due to surgical blood loss or risk of bleeding: no

## 2022-05-25 NOTE — Anesthesia Preprocedure Evaluation (Signed)
Anesthesia Evaluation  Patient identified by MRN, date of birth, ID band Patient awake    Reviewed: Allergy & Precautions, NPO status , Patient's Chart, lab work & pertinent test results  Airway Mallampati: II  TM Distance: >3 FB Neck ROM: Full    Dental no notable dental hx.    Pulmonary neg pulmonary ROS, former smoker,    Pulmonary exam normal breath sounds clear to auscultation       Cardiovascular hypertension, Pt. on medications Normal cardiovascular exam Rhythm:Regular Rate:Normal     Neuro/Psych negative neurological ROS  negative psych ROS   GI/Hepatic Neg liver ROS, GERD  ,  Endo/Other  Morbid obesity  Renal/GU negative Renal ROS  negative genitourinary   Musculoskeletal negative musculoskeletal ROS (+)   Abdominal   Peds negative pediatric ROS (+)  Hematology negative hematology ROS (+)   Anesthesia Other Findings   Reproductive/Obstetrics negative OB ROS                             Anesthesia Physical Anesthesia Plan  ASA: 2  Anesthesia Plan: General   Post-op Pain Management: Regional block*   Induction: Intravenous  PONV Risk Score and Plan: 3 and Ondansetron, Dexamethasone, Midazolam and Treatment may vary due to age or medical condition  Airway Management Planned: LMA  Additional Equipment:   Intra-op Plan:   Post-operative Plan: Extubation in OR  Informed Consent: I have reviewed the patients History and Physical, chart, labs and discussed the procedure including the risks, benefits and alternatives for the proposed anesthesia with the patient or authorized representative who has indicated his/her understanding and acceptance.     Dental advisory given  Plan Discussed with: CRNA and Surgeon  Anesthesia Plan Comments:         Anesthesia Quick Evaluation

## 2022-05-25 NOTE — Op Note (Unsigned)
NAME: Jasmine Mcdaniel, Jasmine Mcdaniel MEDICAL RECORD NO: [id number] ACCOUNT NO: [id number] DATE OF BIRTH: 08-07-1976 FACILITY: [facility]ATION: [id number] PHYSICIAN: Vanita Panda. Magnus Ivan, MD  Operative Report   DATE OF PROCEDURE: 05/25/2022  PREOPERATIVE DIAGNOSIS:  Left ankle displaced lateral malleolus fracture.  POSTOPERATIVE DIAGNOSIS:  Left ankle displaced lateral malleolus fracture.  PROCEDURE:  Open reduction/internal fixation of left ankle unstable lateral malleolus fracture.  IMPLANTS:  Stryker VariAx II 5-hole lateral fibular plate and screws.  SURGEON:  Vanita Panda.  Magnus Ivan, MD  ASSISTANT:  Richardean Canal, PA-C  ANESTHESIA: 1.  Left lower extremity regional block. 2.  General.  TOURNIQUET TIME:  Less than 1 hour.  ANTIBIOTICS:  2 g IV Ancef.  ESTIMATED BLOOD LOSS:  Minimal.  COMPLICATIONS:  None.  INDICATIONS:  The patient is a 46 year old female who slipped and fell, twisting her ankle this past Sunday.  She was seen in one of the urgent care centers and found to have a displaced lateral malleolus fracture.  The ankle mortise was congruent, but  the fracture was shortened and displaced to the point we have recommended surgery for an unstable fracture.  She does have medial ankle pain as well.  I went over the x-rays with her and saw her in the office this week.  I discussed rationale behind the  operative and nonoperative treatment for this injury.  I discussed the risks and benefits of surgery as well.  DESCRIPTION OF PROCEDURE:  After informed consent was obtained, appropriate left ankle was marked.  Anesthesia obtained regional left lower extremity block in the holding room.  She was then brought to the operating room and placed supine on the  operating table.  General anesthesia was then obtained.  A bump was placed under her left hip and a nonsterile tourniquet was placed around her upper left thigh.  Her left thigh, knee, leg, ankle, and foot were prepped and draped with  DuraPrep and  sterile drapes.  A timeout was called and she was identified as correct patient, correct left ankle.  We then used Esmarch to wrap out the leg and tourniquet was inflated to 300 mm of pressure.  I then made a lateral incision over the fracture of the  lateral malleolus and carried this proximally and distally.  We dissected down the fracture site.  It was really a short oblique fracture, not amenable to a lag screw technique.  We were able to easily anatomically reduced the fracture and then we placed  a 5-hole Stryker VariAx plate along the posterolateral aspect of the fibula in an anti-glide position. We secured this with bicortical screws proximally and 2 screws distally to hold the fracture in reduced position.  I then stressed the ankle mortise  under direct fluoroscopy and her ankle mortise stayed congruent, so there was no ligamentous disruption.  We then irrigated the soft tissue with normal saline solution.  We closed the deep tissue over the plate with 0 Vicryl suture followed by 2-0 Vicryl  to close the deep tissue and interrupted 2-0 nylon on the skin.  Xeroform well-padded sterile dressing was applied.  The tourniquet was let down and her toes pinked nicely.  She was taken to recovery room in stable condition with all final counts being  correct.  No complications noted.  Of note, Rexene Edison, PA-C did assist in entire case and assistance was crucial for soft tissue management and retracting as well as help in guiding implant placement and layered closure of the wound.   NIK  D: 05/25/2022 12:21:38 pm T: 05/25/2022 11:52:00 pm  JOB: [id number]/ [id number]

## 2022-05-25 NOTE — Anesthesia Postprocedure Evaluation (Signed)
Anesthesia Post Note  Patient: Jasmine Mcdaniel  Procedure(s) Performed: OPEN REDUCTION INTERNAL FIXATION (ORIF) LEFT LATERAL MALLEOLUS ANKLE FRACTURE (Left: Ankle)     Patient location during evaluation: PACU Anesthesia Type: General Level of consciousness: awake and alert Pain management: pain level controlled Vital Signs Assessment: post-procedure vital signs reviewed and stable Respiratory status: spontaneous breathing, nonlabored ventilation, respiratory function stable and patient connected to nasal cannula oxygen Cardiovascular status: blood pressure returned to baseline and stable Postop Assessment: no apparent nausea or vomiting Anesthetic complications: no   No notable events documented.  Last Vitals:  Vitals:   05/25/22 1300 05/25/22 1315  BP: 131/90 (!) 135/91  Pulse: (!) 110 (!) 102  Resp: 16 11  Temp:  36.9 C  SpO2: 100% 100%    Last Pain:  Vitals:   05/25/22 1315  TempSrc:   PainSc: 0-No pain                 Monie Shere S

## 2022-05-25 NOTE — Progress Notes (Signed)
Assisted Dr. Okey Dupre with left, popliteal/saphenous, ultrasound guided block. Side rails up, monitors on throughout procedure. See vital signs in flow sheet. Tolerated Procedure well.

## 2022-05-25 NOTE — Anesthesia Procedure Notes (Signed)
Anesthesia Regional Block: Adductor canal block (saphenous)   Pre-Anesthetic Checklist: , timeout performed,  Correct Patient, Correct Site, Correct Laterality,  Correct Procedure, Correct Position, site marked,  Risks and benefits discussed,  Surgical consent,  Pre-op evaluation,  At surgeon's request and post-op pain management  Laterality: Left  Prep: chloraprep       Needles:  Injection technique: Single-shot  Needle Type: Echogenic Needle     Needle Length: 9cm      Additional Needles:   Procedures:,,,, ultrasound used (permanent image in chart),,    Narrative:  Start time: 05/25/2022 11:02 AM End time: 05/25/2022 11:05 AM Injection made incrementally with aspirations every 5 mL.  Performed by: Personally  Anesthesiologist: Eilene Ghazi, MD  Additional Notes: Patient tolerated the procedure well without complications

## 2022-05-25 NOTE — Anesthesia Procedure Notes (Signed)
Anesthesia Procedure Image    

## 2022-05-25 NOTE — Interval H&P Note (Signed)
History and Physical Interval Note: The patient is here today due to significant left ankle lateral mass fracture with displacement.  She had a mechanical fall at the beginning of the week.  X-rays show a displaced lateral malleolus fracture which is an unstable fracture and was recommended of reduction/internal fixation.  There is been no acute or interval change in her medical status since she was seen earlier this week.  She understands we are recommending the surgery.  The risks and benefits of surgery been explained in detail and informed consent was obtained.  05/25/2022 10:20 AM  Jasmine Mcdaniel  has presented today for surgery, with the diagnosis of displaced left lateral malleolus fracture.  The various methods of treatment have been discussed with the patient and family. After consideration of risks, benefits and other options for treatment, the patient has consented to  Procedure(s): OPEN REDUCTION INTERNAL FIXATION (ORIF) LEFT LATERAL MALLEOLUS ANKLE FRACTURE (Left) as a surgical intervention.  The patient's history has been reviewed, patient examined, no change in status, stable for surgery.  I have reviewed the patient's chart and labs.  Questions were answered to the patient's satisfaction.     Kathryne Hitch

## 2022-05-25 NOTE — Anesthesia Procedure Notes (Signed)
Procedure Name: LMA Insertion Date/Time: 05/25/2022 11:30 AM Performed by: Elisabeth Cara, CRNA Pre-anesthesia Checklist: Patient identified, Emergency Drugs available, Suction available, Patient being monitored and Timeout performed Patient Re-evaluated:Patient Re-evaluated prior to induction Oxygen Delivery Method: Circle system utilized Preoxygenation: Pre-oxygenation with 100% oxygen Induction Type: IV induction LMA: LMA with gastric port inserted LMA Size: 4.0 Number of attempts: 1 Placement Confirmation: positive ETCO2 and breath sounds checked- equal and bilateral Tube secured with: Tape Dental Injury: Teeth and Oropharynx as per pre-operative assessment

## 2022-05-25 NOTE — Transfer of Care (Signed)
Immediate Anesthesia Transfer of Care Note  Patient: Jasmine Mcdaniel  Procedure(s) Performed: OPEN REDUCTION INTERNAL FIXATION (ORIF) LEFT LATERAL MALLEOLUS ANKLE FRACTURE (Left: Ankle)  Patient Location: PACU  Anesthesia Type:General  Level of Consciousness: awake, alert , oriented and patient cooperative  Airway & Oxygen Therapy: Patient Spontanous Breathing and Patient connected to face mask oxygen  Post-op Assessment: Report given to RN, Post -op Vital signs reviewed and stable and Patient moving all extremities  Post vital signs: Reviewed and stable  Last Vitals:  Vitals Value Taken Time  BP 119/71 05/25/22 1247  Temp    Pulse 110 05/25/22 1249  Resp 14 05/25/22 1249  SpO2 100 % 05/25/22 1249  Vitals shown include unvalidated device data.  Last Pain:  Vitals:   05/25/22 0924  TempSrc: Oral      Patients Stated Pain Goal: 5 (05/25/22 0933)  Complications: No notable events documented.

## 2022-05-26 ENCOUNTER — Other Ambulatory Visit: Payer: Self-pay | Admitting: Physician Assistant

## 2022-05-26 DIAGNOSIS — S8262XA Displaced fracture of lateral malleolus of left fibula, initial encounter for closed fracture: Secondary | ICD-10-CM | POA: Diagnosis not present

## 2022-05-26 MED ORDER — OXYCODONE HCL 5 MG PO TABS: 5.0000 mg | ORAL_TABLET | ORAL | 0 refills | Status: DC | PRN

## 2022-05-26 MED ORDER — METHOCARBAMOL 750 MG PO TABS: 750.0000 mg | ORAL_TABLET | Freq: Two times a day (BID) | ORAL | 0 refills | Status: AC | PRN

## 2022-05-26 NOTE — Evaluation (Signed)
Physical Therapy Evaluation Patient Details Name: Jasmine Mcdaniel MRN: [id number] DOB: May 17, 1976 Today's Date: 05/26/2022  History of Present Illness  46 yo female s/p ORIF L lateral malleolus 05/25/22.  Clinical Impression  On eval, pt was Supv-Min guard assist for mobility. She walked ~25 feet with a RW. Practiced negotiating stairs with 1 crutch, 1 rail. Discussed getting on/off high height bed at home-pt has 2 steps that she has been using to get on (reviewed technique of stepping up backwards with use of RW; pt stated she has been hopping up with crutches)-discussed using method she prefers/feels most comfortable with. Pt has already been mobilizing at home for a few days before this surgery. All education completed. Will recommend a RW and 3n1 for home.      Recommendations for follow up therapy are one component of a multi-disciplinary discharge planning process, led by the attending physician.  Recommendations may be updated based on patient status, additional functional criteria and insurance authorization.  Follow Up Recommendations No PT follow up    Assistance Recommended at Discharge PRN  Patient can return home with the following  Help with stairs or ramp for entrance;Assist for transportation;Assistance with cooking/housework    Equipment Recommendations Rolling walker (2 wheels);BSC/3in1  Recommendations for Other Services       Functional Status Assessment Patient has had a recent decline in their functional status and demonstrates the ability to make significant improvements in function in a reasonable and predictable amount of time.     Precautions / Restrictions Precautions Precautions: Fall Restrictions Weight Bearing Restrictions: No Other Position/Activity Restrictions: WBAT with CAM boot      Mobility  Bed Mobility Overal bed mobility: Modified Independent                  Transfers Overall transfer level: Needs assistance Equipment used:  Rolling walker (2 wheels) Transfers: Sit to/from Stand Sit to Stand: Supervision           General transfer comment: Cues for safety    Ambulation/Gait Ambulation/Gait assistance: Supervision Gait Distance (Feet): 25 Feet Assistive device: Rolling walker (2 wheels) Gait Pattern/deviations: Step-to pattern       General Gait Details: Cues for safety, sequence, technqiue, and for pt to try to place foot flat instead of remainin on toes.  Stairs Stairs: Yes Min guard assist   Stair Management: Step to pattern, Forwards, One rail Left, With crutches Number of Stairs: 5 General stair comments: Ascended/descended stairs using 1 crutch/1 rail. Cues for safety, sequence, technique. Min guard for safety. Pt tends to try to use hop/ toe touch technique. Educated her on trying to place foot on step and take a little weight to assist with balancing.  Wheelchair Mobility    Modified Rankin (Stroke Patients Only)       Balance Overall balance assessment: Needs assistance, History of Falls         Standing balance support: During functional activity, Bilateral upper extremity supported Standing balance-Leahy Scale: Fair                               Pertinent Vitals/Pain Pain Assessment Pain Assessment: 0-10 Pain Score: 8  Pain Location: L ankle Pain Descriptors / Indicators: Discomfort, Sore, Operative site guarding Pain Intervention(s): Monitored during session, Limited activity within patient's tolerance, Repositioned    Home Living Family/patient expects to be discharged to:: Private residence Living Arrangements: Spouse/significant other;Children Available Help at Discharge: Family  Type of Home: House Home Access: Stairs to enter Entrance Stairs-Rails: Psychiatric nurse of Steps: 6   Home Layout: One level Home Equipment: Crutches      Prior Function Prior Level of Function : Independent/Modified Independent                      Hand Dominance        Extremity/Trunk Assessment   Upper Extremity Assessment Upper Extremity Assessment: Overall WFL for tasks assessed    Lower Extremity Assessment Lower Extremity Assessment: LLE deficits/detail LLE Deficits / Details: ace wrap in place    Cervical / Trunk Assessment Cervical / Trunk Assessment: Normal  Communication   Communication: No difficulties  Cognition Arousal/Alertness: Awake/alert Behavior During Therapy: WFL for tasks assessed/performed Overall Cognitive Status: Within Functional Limits for tasks assessed                                          General Comments      Exercises     Assessment/Plan    PT Assessment Patient needs continued PT services  PT Problem List Decreased strength;Decreased mobility;Decreased range of motion;Decreased activity tolerance;Decreased balance;Decreased knowledge of use of DME;Pain       PT Treatment Interventions DME instruction;Gait training;Therapeutic activities;Therapeutic exercise;Patient/family education;Balance training;Functional mobility training;Stair training    PT Goals (Current goals can be found in the Care Plan section)  Acute Rehab PT Goals Patient Stated Goal: less pain. regain PLOF/independence PT Goal Formulation: All assessment and education complete, DC therapy    Frequency Min 5X/week     Co-evaluation               AM-PAC PT "6 Clicks" Mobility  Outcome Measure Help needed turning from your back to your side while in a flat bed without using bedrails?: A Little Help needed moving from lying on your back to sitting on the side of a flat bed without using bedrails?: A Little Help needed moving to and from a bed to a chair (including a wheelchair)?: A Little Help needed standing up from a chair using your arms (e.g., wheelchair or bedside chair)?: A Little Help needed to walk in hospital room?: A Little Help needed climbing 3-5 steps with a  railing? : A Little 6 Click Score: 18    End of Session Equipment Utilized During Treatment: Gait belt (CAM boot) Activity Tolerance: Patient tolerated treatment well Patient left: in chair;with call bell/phone within reach;with family/visitor present   PT Visit Diagnosis: Pain;Difficulty in walking, not elsewhere classified (R26.2);History of falling (Z91.81);Other abnormalities of gait and mobility (R26.89) Pain - Right/Left: Left Pain - part of body: Ankle and joints of foot    Time: 1100-1140 PT Time Calculation (min) (ACUTE ONLY): 40 min   Charges:   PT Evaluation $PT Eval Low Complexity: 1 Low PT Treatments $Gait Training: 23-37 mins           Doreatha Massed, PT Acute Rehabilitation  Office: [phone] Pager: [phone]

## 2022-05-26 NOTE — TOC Transition Note (Signed)
Transition of Care [facility]) - CM/SW Discharge Note   Patient Details  Name: Jasmine Mcdaniel MRN: [id number] Date of Birth: 08/24/1976  Transition of Care [facility]) CM/SW Contact:  Darleene Cleaver, LCSW Phone Number: 05/26/2022, 12:41 PM   Clinical Narrative:     CSW was informed that patient needs a rolling walker and a 3 in 1.  CSW spoke to patient she said she does not have one, CSW contacted [employer], and they will deliver before patient leaves today.  Patient did not express any other needs or concerns, CSW signing off, please reconsult if other social work needs arise.   Final next level of care: Home/Self Care Barriers to Discharge: Barriers Resolved   Patient Goals and CMS Choice Patient states their goals for this hospitalization and ongoing recovery are:: To return back home. CMS Medicare.gov Compare Post Acute Care list provided to:: Patient Choice offered to / list presented to : Patient  Discharge Placement                       Discharge Plan and Services                DME Arranged: Walker rolling, 3-N-1 DME Agency: [employer] Date DME Agency Contacted: 05/26/22 Time DME Agency Contacted: 1045 Representative spoke with at DME Agency: Leavy Cella            Social Determinants of Health (SDOH) Interventions     Readmission Risk Interventions     View : No data to display.

## 2022-05-26 NOTE — Plan of Care (Signed)
  Problem: Education: Goal: Knowledge of General Education information will improve Description Including pain rating scale, medication(s)/side effects and non-pharmacologic comfort measures Outcome: Progressing   Problem: Health Behavior/Discharge Planning: Goal: Ability to manage health-related needs will improve Outcome: Progressing   

## 2022-05-26 NOTE — Progress Notes (Addendum)
The patient is alert and oriented and has been seen by her physician. The orders for discharge were written. Patient stated that Oxycodone is the medication that best helps her with her pain. RN notified PA to request that a muscle relaxer be sent to the patient's pharmacy to also help control pain at home. Patient was eager to be discharged. IV has been removed. Went over discharge instructions with patient and family. She is being discharged via wheelchair with all of her belongings.

## 2022-05-26 NOTE — Progress Notes (Signed)
Subjective: 1 Day Post-Op Procedure(s) (LRB): OPEN REDUCTION INTERNAL FIXATION (ORIF) LEFT LATERAL MALLEOLUS ANKLE FRACTURE (Left) Patient reports pain as moderate.    Objective: Vital signs in last 24 hours: Temp:  [97.7 F (36.5 C)-99.1 F (37.3 C)] 98.3 F (36.8 C) (06/03 0656) Pulse Rate:  [65-116] 107 (06/03 0656) Resp:  [7-18] 18 (06/03 0656) BP: (119-149)/(71-95) 149/85 (06/03 0656) SpO2:  [89 %-100 %] 100 % (06/03 0656) Weight:  [106.6 kg] 106.6 kg (06/02 0933)  Intake/Output from previous day: 06/02 0701 - 06/03 0700 In: 3125.6 [P.O.:1080; I.V.:1814.1; IV Piggyback:231.5] Out: 10 [Blood:10] Intake/Output this shift: No intake/output data recorded.  No results for input(s): HGB in the last 72 hours. No results for input(s): WBC, RBC, HCT, PLT in the last 72 hours. Recent Labs    05/24/22 0845  NA 138  K 4.2  CL 105  CO2 27  BUN 9  CREATININE 0.72  GLUCOSE 113*  CALCIUM 8.8*   No results for input(s): LABPT, INR in the last 72 hours.  Intact pulses distally Incision: dressing C/D/I Compartment soft   Assessment/Plan: 1 Day Post-Op Procedure(s) (LRB): OPEN REDUCTION INTERNAL FIXATION (ORIF) LEFT LATERAL MALLEOLUS ANKLE FRACTURE (Left) Up with therapy - can try WBAT in boot Discharge to home today.      Kathryne Hitch 05/26/2022, 8:21 AM

## 2022-05-26 NOTE — Discharge Instructions (Addendum)
You may attempt to put all of your weight on your left ankle in your boot as comfort allows. Do expect left ankle swelling - ice and elevation several times daily. You can remove the Ace wrap to shower. The dressing over your incision can get wet and can be changed every 4-5 days if needed.

## 2022-05-26 NOTE — TOC CM/SW Note (Signed)
  Transition of Care [facility]) Screening Note   Patient Details  Name: Jasmine Mcdaniel Date of Birth: July 10, 1976   Transition of Care [facility]) CM/SW Contact:    Darleene Cleaver, LCSW Phone Number: 05/26/2022, 11:10 AM    Transition of Care Department [facility]) has reviewed patient and no TOC needs have been identified at this time. We will continue to monitor patient advancement through interdisciplinary progression rounds. If new patient transition needs arise, please place a TOC consult.

## 2022-05-27 NOTE — Discharge Summary (Signed)
Patient ID: Jasmine Mcdaniel MRN: [id number] DOB/AGE: 01/11/1976 46 y.o.  Admit date: 05/25/2022 Discharge date: 05/26/2022  Admission Diagnoses:  Principal Problem:   Closed displaced fracture of lateral malleolus of left fibula Active Problems:   Closed left ankle fracture   Discharge Diagnoses:  Same  Past Medical History:  Diagnosis Date   GERD (gastroesophageal reflux disease)    History of migraine headaches    Hypertension     Surgeries: Procedure(s): OPEN REDUCTION INTERNAL FIXATION (ORIF) LEFT LATERAL MALLEOLUS ANKLE FRACTURE on 05/25/2022   Consultants:   Discharged Condition: Improved  Hospital Course: Jasmine Mcdaniel is an 46 y.o. female who was admitted 05/25/2022 for operative treatment ofClosed displaced fracture of lateral malleolus of left fibula. Patient has severe unremitting pain that affects sleep, daily activities, and work/hobbies. After pre-op clearance the patient was taken to the operating room on 05/25/2022 and underwent  Procedure(s): OPEN REDUCTION INTERNAL FIXATION (ORIF) LEFT LATERAL MALLEOLUS ANKLE FRACTURE.    Patient was given perioperative antibiotics:  Anti-infectives (From admission, onward)    Start     Dose/Rate Route Frequency Ordered Stop   05/25/22 1800  ceFAZolin (ANCEF) IVPB 1 g/50 mL premix        1 g 100 mL/hr over 30 Minutes Intravenous Every 6 hours 05/25/22 1444 05/26/22 1254   05/25/22 0915  ceFAZolin (ANCEF) IVPB 2g/100 mL premix        2 g 200 mL/hr over 30 Minutes Intravenous On call to O.R. 05/25/22 0908 05/25/22 1201        Patient was given sequential compression devices, early ambulation, and chemoprophylaxis to prevent DVT.  Patient benefited maximally from hospital stay and there were no complications.    Recent vital signs: No data found.   Recent laboratory studies: No results for input(s): WBC, HGB, HCT, PLT, NA, K, CL, CO2, BUN, CREATININE, GLUCOSE, INR, CALCIUM in the last 72 hours.  Invalid input(s): PT,  2   Discharge Medications:   Allergies as of 05/26/2022       Reactions   Bee Pollen Anaphylaxis        Medication List     STOP taking these medications    traMADol 50 MG tablet Commonly known as: ULTRAM       TAKE these medications    acetaminophen 500 MG tablet Commonly known as: TYLENOL Take 1,000 mg by mouth every 6 (six) hours as needed for mild pain.   amLODipine 5 MG tablet Commonly known as: NORVASC Take 5 mg by mouth daily.   Diclofenac Sodium CR 100 MG 24 hr tablet Take 1 tablet (100 mg total) by mouth daily.   diphenhydramine-acetaminophen 25-500 MG Tabs tablet Commonly known as: TYLENOL PM Take 1 tablet by mouth at bedtime as needed (sleep).   fluticasone 50 MCG/ACT nasal spray Commonly known as: FLONASE Place 2 sprays into both nostrils daily.   ibuprofen 800 MG tablet Commonly known as: ADVIL Take 800 mg by mouth every 8 (eight) hours as needed for pain.   multivitamin capsule Take 1 capsule by mouth daily.        Diagnostic Studies: DG Wrist Complete Right  Result Date: 05/20/2022 CLINICAL DATA:  Right wrist pain. EXAM: RIGHT WRIST - COMPLETE 3+ VIEW COMPARISON:  Right wrist x-ray 04/25/2022. FINDINGS: There is no evidence for acute fracture or dislocation. Old ulnar styloid avulsion fracture again seen. There is no evidence of arthropathy or other focal bone abnormality. Soft tissues are unremarkable. IMPRESSION: No acute bony abnormality. Electronically Signed  By: Jasmine Mcdaniel M.D.   On: 05/20/2022 23:48   DG Ankle Complete Left  Result Date: 05/25/2022 CLINICAL DATA:  Intraoperative fluoroscopy for left ankle ORIF EXAM: LEFT ANKLE COMPLETE - 3+ VIEW COMPARISON:  Left ankle radiographs 05/20/2022 FINDINGS: Images were performed intraoperatively without the presence of a radiologist. The patient is undergoing lateral plate and screw fixation of the distal fibula diaphyseal oblique fracture. Improved, now normal alignment. Total fluoroscopy  images: 3 Total fluoroscopy time: 18 seconds Total dose: Radiation Exposure Index (as provided by the fluoroscopic device): 0.577 mGy air Kerma Please see intraoperative findings for further detail. IMPRESSION: Intraoperative fluoroscopy for left fibular ORIF. Electronically Signed   By: Jasmine Mcdaniel M.D.   On: 05/25/2022 12:36   DG Knee Complete 4 Views Left  Result Date: 05/21/2022 CLINICAL DATA:  Fall, left leg pain EXAM: LEFT KNEE - COMPLETE 4+ VIEW COMPARISON:  None Available. FINDINGS: No fracture or dislocation is seen. The joint spaces are preserved. Visualized soft tissues are within normal limits. No suprapatellar knee joint effusion. IMPRESSION: Negative. Electronically Signed   By: Jasmine Mcdaniel M.D.   On: 05/21/2022 00:29   DG Tibia/Fibula Left Port  Result Date: 05/20/2022 CLINICAL DATA:  Fall. EXAM: PORTABLE LEFT TIBIA AND FIBULA - 2 VIEW COMPARISON:  Ankle x-ray same day FINDINGS: There is no fracture or dislocation of the mid or proximal left tibia and fibula. Please see dedicated ankle x-ray for further description. Soft tissues are within normal limits. IMPRESSION: No acute fracture or dislocation of the mid/proximal left tibia/fibula. Electronically Signed   By: Jasmine Mcdaniel M.D.   On: 05/20/2022 23:49   DG Ankle Left Port  Result Date: 05/20/2022 CLINICAL DATA:  Fall. EXAM: PORTABLE LEFT ANKLE - 2 VIEW COMPARISON:  None Available. FINDINGS: There is an acute oblique fracture through the distal fibular diaphysis 1.9 cm proximal to the ankle mortise. There is 5 mm of distraction of the fracture site the distal portion displaced posteriorly and laterally. No other acute fractures are seen. Joint spaces are maintained. There is soft tissue swelling surrounding the ankle. IMPRESSION: 1. Mildly displaced distal fibular fracture. Electronically Signed   By: Jasmine Mcdaniel M.D.   On: 05/20/2022 23:48   DG C-Arm 1-60 Min-No Report  Result Date: 05/25/2022 Fluoroscopy was utilized by  the requesting physician.  No radiographic interpretation.    Disposition: Discharge disposition: 01-Home or Self Care          Follow-up Information     Jasmine Rossetti, MD Follow up in 2 week(s).   Specialty: Orthopedic Surgery Contact information: [address] [place] [address] [phone]                  Signed: Mcarthur Mcdaniel 05/27/2022, 6:53 PM

## 2022-05-28 ENCOUNTER — Encounter: Payer: Self-pay | Admitting: Orthopaedic Surgery

## 2022-05-28 ENCOUNTER — Ambulatory Visit (INDEPENDENT_AMBULATORY_CARE_PROVIDER_SITE_OTHER): Payer: Medicaid Other | Admitting: Orthopaedic Surgery

## 2022-05-28 DIAGNOSIS — S8262XD Displaced fracture of lateral malleolus of left fibula, subsequent encounter for closed fracture with routine healing: Secondary | ICD-10-CM

## 2022-05-28 MED ORDER — HYDROCODONE-ACETAMINOPHEN 7.5-325 MG PO TABS: 1.0000 | ORAL_TABLET | Freq: Four times a day (QID) | ORAL | 0 refills | Status: DC | PRN

## 2022-05-28 NOTE — Progress Notes (Signed)
The patient is only 3 days status post open reduction/central fixation of the left lateral malleolus fracture that was displaced.  She comes in today concerned about her incision and throbbing pain she is having.  However she has not been able to get her pain medication filled which was oxycodone for insurance purposes.  I gave her reassurance that the left ankle incision looks good.  There is really no drainage from it.  There is no redness and no evidence of infection at all.  I did place new dressing over her ankle incision.  She will keep it dry.  She can attempt weightbearing as tolerated but will back off if she is not tolerating the pain.  We will see her back at her regular visit toward the end of next week with a repeat 3 views of her left ankle and suture removal.  I will send in some hydrocodone for pain.

## 2022-05-29 ENCOUNTER — Encounter (HOSPITAL_COMMUNITY): Payer: Self-pay | Admitting: Orthopaedic Surgery

## 2022-05-31 ENCOUNTER — Encounter (HOSPITAL_COMMUNITY): Payer: Self-pay | Admitting: Orthopaedic Surgery

## 2022-06-06 ENCOUNTER — Encounter: Payer: Medicaid Other | Admitting: Orthopaedic Surgery

## 2022-06-11 ENCOUNTER — Ambulatory Visit (INDEPENDENT_AMBULATORY_CARE_PROVIDER_SITE_OTHER): Payer: Medicaid Other | Admitting: Orthopaedic Surgery

## 2022-06-11 ENCOUNTER — Encounter: Payer: Self-pay | Admitting: Orthopaedic Surgery

## 2022-06-11 ENCOUNTER — Ambulatory Visit (INDEPENDENT_AMBULATORY_CARE_PROVIDER_SITE_OTHER): Payer: Medicaid Other

## 2022-06-11 DIAGNOSIS — Z9889 Other specified postprocedural states: Secondary | ICD-10-CM | POA: Diagnosis not present

## 2022-06-11 DIAGNOSIS — Z8781 Personal history of (healed) traumatic fracture: Secondary | ICD-10-CM

## 2022-06-11 DIAGNOSIS — S8262XD Displaced fracture of lateral malleolus of left fibula, subsequent encounter for closed fracture with routine healing: Secondary | ICD-10-CM

## 2022-06-11 MED ORDER — OXYCODONE HCL 5 MG PO TABS: 5.0000 mg | ORAL_TABLET | ORAL | 0 refills | Status: DC | PRN

## 2022-06-11 MED ORDER — CELECOXIB 200 MG PO CAPS
200.0000 mg | ORAL_CAPSULE | Freq: Two times a day (BID) | ORAL | 1 refills | Status: DC | PRN
Start: 2022-06-11 — End: 2022-09-19

## 2022-06-11 NOTE — Progress Notes (Signed)
The patient is 2 weeks status post open reduction/central fixation of a displaced lateral malleolus fracture of the left ankle.  This is her first postoperative visit.  The left ankle incision looks great.  The sutures can be removed and Steri-Strips applied.  The ankle is clinically well located.  The swelling is minimal.  3 views left ankle show the fracture is in anatomic alignment and the ankle mortise is congruent.  She can weight-bear as tolerated in her cam walking boot.  I will refill her oxycodone and have cautioned her about not taking too much.  I will send an anti-inflammatory as well.  I would like to see her back in 4 weeks with a repeat 3 views of her left ankle.

## 2022-07-06 ENCOUNTER — Telehealth: Payer: Self-pay | Admitting: Orthopaedic Surgery

## 2022-07-06 ENCOUNTER — Other Ambulatory Visit: Payer: Self-pay | Admitting: Orthopaedic Surgery

## 2022-07-06 MED ORDER — OXYCODONE HCL 5 MG PO TABS: 5.0000 mg | ORAL_TABLET | Freq: Four times a day (QID) | ORAL | 0 refills | Status: DC | PRN

## 2022-07-06 NOTE — Telephone Encounter (Signed)
Please advise 

## 2022-07-06 NOTE — Telephone Encounter (Signed)
Pt called requesting a refill. Please send to pharmacy on file. Pt phone number is [phone].

## 2022-07-09 ENCOUNTER — Ambulatory Visit (INDEPENDENT_AMBULATORY_CARE_PROVIDER_SITE_OTHER): Payer: Medicaid Other

## 2022-07-09 ENCOUNTER — Ambulatory Visit (INDEPENDENT_AMBULATORY_CARE_PROVIDER_SITE_OTHER): Payer: Medicaid Other | Admitting: Orthopaedic Surgery

## 2022-07-09 ENCOUNTER — Encounter: Payer: Self-pay | Admitting: Orthopaedic Surgery

## 2022-07-09 DIAGNOSIS — Z8781 Personal history of (healed) traumatic fracture: Secondary | ICD-10-CM | POA: Diagnosis not present

## 2022-07-09 DIAGNOSIS — Z9889 Other specified postprocedural states: Secondary | ICD-10-CM

## 2022-07-09 DIAGNOSIS — S8262XD Displaced fracture of lateral malleolus of left fibula, subsequent encounter for closed fracture with routine healing: Secondary | ICD-10-CM

## 2022-07-09 MED ORDER — HYDROCODONE-ACETAMINOPHEN 7.5-325 MG PO TABS: 1.0000 | ORAL_TABLET | Freq: Four times a day (QID) | ORAL | 0 refills | Status: DC | PRN

## 2022-07-09 NOTE — Progress Notes (Signed)
The patient is now 6 weeks status post open reduction/central fixation of a complex lateral malleolus fracture which was displaced of her left ankle.  She has been having some pain with throbbing of the ankle and been in a cam walking boot.  She has not put much weight on her ankle because she says she cannot tolerate it yet.  On my exam today the ankle swollen is gone down.  Her left lateral ankle incision is healed.  Her calf is not tight and there is no palpable cords.  She does have some limitations in flexion extension of the left ankle.  3 views left ankle show the fracture is in good alignment and healing appropriately.  The hardware is intact.  I would like to send her to outpatient physical therapy.  They can work on range of motion of her left ankle and proprioception with weightbearing as tolerated.  Any modalities per the therapist discretion for also edema reduction.  We will transition her from the cam walking boot to an ASO.  I would also like to wean her from oxycodone to hydrocodone.  All questions and concerns were answered addressed.  We will see her back in 4 weeks.  We will have a repeat 3 views of the left ankle at that visit.

## 2022-07-09 NOTE — Addendum Note (Signed)
Addended by: Wendi Maya on: 07/09/2022 02:47 PM   Modules accepted: Orders

## 2022-07-19 NOTE — Therapy (Signed)
OUTPATIENT PHYSICAL THERAPY LOWER EXTREMITY EVALUATION   Patient Name: Jasmine Mcdaniel MRN: [id number] DOB:Feb 10, 1976, 46 y.o., female Today's Date: 07/23/2022   PT End of Session - 07/23/22 1531     Visit Number 1    Number of Visits 17    Date for PT Re-Evaluation 09/17/22    Authorization Type [employer]    PT Start Time 1531    PT Stop Time 1631    PT Time Calculation (min) 60 min    Activity Tolerance Patient tolerated treatment well    Behavior During Therapy WFL for tasks assessed/performed             Past Medical History:  Diagnosis Date   GERD (gastroesophageal reflux disease)    History of migraine headaches    Hypertension    Past Surgical History:  Procedure Laterality Date   LEG SURGERY Left    ORIF ANKLE FRACTURE Left 05/25/2022   Procedure: OPEN REDUCTION INTERNAL FIXATION (ORIF) LEFT LATERAL MALLEOLUS ANKLE FRACTURE;  Surgeon: Kathryne Hitch, MD;  Location: [facility];  Service: Orthopedics;  Laterality: Left;   WISDOM TOOTH EXTRACTION     Patient Active Problem List   Diagnosis Date Noted   Closed left ankle fracture 05/25/2022   Closed displaced fracture of lateral malleolus of left fibula 05/23/2022    PCP: Daylene Katayama, PA  REFERRING PROVIDER: Kathryne Hitch, MD  REFERRING DIAG: [phone] (ICD-10-CM) - S/P ORIF (open reduction internal fixation) fracture  THERAPY DIAG:  Pain in left ankle and joints of left foot  Stiffness of left ankle, not elsewhere classified  Other abnormalities of gait and mobility  Difficulty in walking, not elsewhere classified  Unsteadiness on feet  Other symptoms and signs involving the musculoskeletal system  RATIONALE FOR EVALUATION AND TREATMENT: Rehabilitation  ONSET DATE: 05/25/2022 - ORIF left lateral malleolus ankle fracture   NEXT MD VISIT: 08/13/22   SUBJECTIVE:   SUBJECTIVE STATEMENT: Pt reports she was wearing slippers when her dog caused to make a sudden  move twisting her ankle and causing a fracture. Surgery 2 days later for ORIF left lateral malleolus ankle fracture . Initially WBAT in air cast post-op and now 2 weeks since transitioning to lace-up ankle brace. She reports she has recently weaned herself from any AD. Her ankle remains painful at times with intermittent edema. Still tries to elevate her ankle and use ice to manage pain and edema.  PAIN:  Are you having pain? Yes: NPRS scale:  9/10 Pain location: medial and lateral L ankle Pain description: throbbing, pulsing Aggravating factors: walking Relieving factors: ice, elevation  PERTINENT HISTORY: 05/25/2022 - ORIF left lateral malleolus ankle fracture (05/20/22); HTN, migraines, GERD, LBP  PRECAUTIONS: None  WEIGHT BEARING RESTRICTIONS: Yes WBAT on R  FALLS:  Has patient fallen in last 6 months? Yes. 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  PATIENT SURVEYS:  LEFS TBD next visit  COGNITION: Overall cognitive status: Within functional limits for tasks assessed     SENSATION: WFL Except numbness and occasional itching or burning along surgical  incision  EDEMA:  Significant imprint visible from lace-up ankle brace with proximal and distal mild/mod edema.  MUSCLE LENGTH: Hamstrings: very mild tightness B ITB: mild tight B Piriformis: mod tight B Hip flexors: mild tight B Quads: mild tight B Heelcord: mild tight on R mod/severe tight on L  POSTURE:  B pes planus  PALPATION: Decreased scar mobility L lateral ankle surgical incision  LOWER EXTREMITY ROM:  Active ROM Right eval Left eval  Ankle dorsiflexion +9 -5  Ankle plantarflexion 45 28  Ankle inversion 36 11  Ankle eversion 25 5    Passive ROM Left eval  Ankle dorsiflexion -2  Ankle plantarflexion 34  Ankle inversion 14  Ankle eversion 11   (Blank rows = not tested)  LOWER EXTREMITY MMT:  MMT Right eval Left eval  Hip flexion 4+ 4  Hip extension 5 4+  Hip abduction 4+ 4  Hip adduction 4 4-  Hip internal rotation 4+ 4+  Hip external rotation 4 3+  Knee flexion 5 4-  Knee extension 5 4  Ankle dorsiflexion 5 2+  Ankle plantarflexion  3-  Ankle inversion 5 2+  Ankle eversion 5 2+   (Blank rows = not tested)  GAIT: Distance walked: 60 ft Assistive device utilized: Single point cane and None Level of assistance: Complete Independence and Modified independence Gait pattern: decreased step length- Right, decreased stance time- Left, decreased ankle dorsiflexion- Left, and antalgic Comments: pt limping upon arrival w/o AD - somewhat better with SPC but still with decreased wt shift to L and decreased R step length   TODAY'S TREATMENT:  07/23/22 THERAPEUTIC EXERCISE:Instruction in initial HEP (see below) to improve flexibility, strength and mobility.  Verbal and tactile cues throughout for technique.  SELF CARE: Provided instruction in incisional scar massage     PATIENT EDUCATION:  Education details: PT eval findings, anticipated POC, initial HEP, and incisional scar massage Person educated: Patient Education method: Explanation,  Demonstration, Tactile cues, Verbal cues, and Handouts Education comprehension: verbalized understanding, returned demonstration, verbal cues required, tactile cues required, and needs further education   HOME EXERCISE PROGRAM: Access Code: [email or web address] URL: [email or web address] Date: 07/23/2022 Prepared by: Glenetta Hew  Exercises - Long Sitting Calf Stretch with Strap  - 2-3 x daily - 7 x weekly - 3 reps - 30 sec hold - Long Sitting Soleus Stretch on Bolster with Strap (Mirrored)  - 2-3 x daily - 7 x weekly - 3 reps - 30 sec hold - Long Sitting Ankle Pumps (Mirrored)  - 2-3 x daily - 7 x weekly - 2 sets - 10 reps - 3 sec hold - Supine Ankle Circles  - 2-3 x daily - 7 x weekly - 2 sets - 10 reps  Patient Education - Scar Massage  ASSESSMENT:  CLINICAL IMPRESSION: CHESLEY VALLS is a 46 y.o. female who was seen today for physical therapy evaluation and treatment for L ankle s/p ORIF left lateral malleolus ankle fracture on 05/25/22. She is currently WBAT on L in lace-up ankle brace and has self-weaned from all AD but ambulates with a pronounced limp/antalgic gait pattern with  decreased weight shift to L, limited stance time on L and decreased R step length. Encouraged continued use of SPC until better able to tolerate full weight bearing through L LE with gait training provided for normal step pattern with SPC. Deficits include L ankle pain and edema, decreased L ankle ROM, decreased LE flexibility, decreased L>R LE strength with L ankle <3/5, and impaired mobility with antalgic gait pattern.  OBJECTIVE IMPAIRMENTS: Abnormal gait, decreased activity tolerance, decreased balance, decreased coordination, decreased endurance, decreased knowledge of condition, decreased knowledge of use of DME, decreased mobility, difficulty walking, decreased ROM, decreased strength, decreased safety awareness, increased edema, increased fascial restrictions, impaired perceived functional ability,  increased muscle spasms, impaired flexibility, impaired sensation, improper body mechanics, postural dysfunction, and pain.   ACTIVITY LIMITATIONS: standing, squatting, stairs, transfers, and locomotion level  PARTICIPATION LIMITATIONS: meal prep, cleaning, laundry, driving, shopping, community activity, occupation, and yard work  PERSONAL FACTORS: Past/current experiences, Profession, Time since onset of injury/illness/exacerbation, and 3+ comorbidities: HTN, migraines, GERD, LBP  are also affecting patient's functional outcome.   REHAB POTENTIAL: Good  CLINICAL DECISION MAKING: Stable/uncomplicated  EVALUATION COMPLEXITY: Low   GOALS: Goals reviewed with patient? Yes  SHORT TERM GOALS: Target date: 08/20/2022   Patient will be independent with initial HEP. Baseline: Initial HEP provided on eval Goal status: INITIAL  2.  Patient will demonstrate improved L ankle DF AROM to >/= neutral to improve gait mechanics. Baseline: Refer to above AROM chart Goal status: INITIAL  3.  Patient will ambulate with LRAD demonstrating step-through pattern and improved gait mechanics including normal heel-toe progression on L w/o increased L ankle pain. Baseline: Antalgic gait with decreased weight shift to L, decreased stance time on L and decreased R step length Goal status: INITIAL   LONG TERM GOALS: Target date: 09/17/2022   Patient will be independent with advanced/ongoing HEP to improve outcomes and carryover.  Baseline: Initial HEP provided on eval Goal status: INITIAL  2.  Patient will report at least 75% improvement in L ankle pain to improve QOL. Baseline: 9/10 Goal status: INITIAL  3.  Patient will demonstrate improved L ankle AROM to [facility] with at least 10 DF to allow for normal gait and stair mechanics. Baseline: Refer to above AROM chart - AROM DF lacking 5 from neutral Goal status: INITIAL  4.  Patient will demonstrate improved B LE strength to >/= 4+/5 with L ankle PF at  least 3/5 for improved stability and ease of mobility . Baseline: Refer to above MMT chart Goal status: INITIAL  5.  Patient will be able to ambulate 600' with LRAD and normal gait pattern without increased foot/ankle pain to access community.  Baseline: Antalgic gait with decreased weight shift to L, decreased stance time on L and decreased R step length Goal status: INITIAL  6. Patient will be able to ascend/descend stairs with 1 HR and reciprocal step pattern safely to access home and community.  Baseline: NT on eval Goal status: INITIAL  7.  Patient will report at least 9 point improvement on LEFS to demonstrate improved functional ability. Baseline: TBD next visit Goal status: INITIAL  8.  Patient will demonstrate at least 19/24 on DGI to decrease risk of falls. Baseline: NT on eval Goal status: INITIAL    PLAN: PT FREQUENCY: 2x/week  PT DURATION: 8 weeks  PLANNED INTERVENTIONS: Therapeutic exercises, Therapeutic activity, Neuromuscular re-education, Balance training, Gait training, Patient/Family education, Self Care, Joint mobilization, Stair training, DME instructions, Dry Needling, Electrical stimulation, Cryotherapy, Moist  heat, Taping, Vasopneumatic device, Ultrasound, Ionotophoresis 4mg /ml Dexamethasone, Manual therapy, and Re-evaluation  PLAN FOR NEXT SESSION: Complete LEFS; review initial HEP; review gait training with SPC or LRAD to normalize gait pattern; gently progress L ankle ROM and strengthening   , PT 07/23/2022, 6:04 PM

## 2022-07-23 ENCOUNTER — Other Ambulatory Visit: Payer: Self-pay

## 2022-07-23 ENCOUNTER — Encounter: Payer: Self-pay | Admitting: Physical Therapy

## 2022-07-23 ENCOUNTER — Ambulatory Visit: Payer: Medicaid Other | Attending: Orthopaedic Surgery | Admitting: Physical Therapy

## 2022-07-23 DIAGNOSIS — Z8781 Personal history of (healed) traumatic fracture: Secondary | ICD-10-CM | POA: Diagnosis not present

## 2022-07-23 DIAGNOSIS — M25672 Stiffness of left ankle, not elsewhere classified: Secondary | ICD-10-CM | POA: Diagnosis present

## 2022-07-23 DIAGNOSIS — M25572 Pain in left ankle and joints of left foot: Secondary | ICD-10-CM | POA: Diagnosis present

## 2022-07-23 DIAGNOSIS — R2681 Unsteadiness on feet: Secondary | ICD-10-CM | POA: Diagnosis present

## 2022-07-23 DIAGNOSIS — Z9889 Other specified postprocedural states: Secondary | ICD-10-CM | POA: Diagnosis not present

## 2022-07-23 DIAGNOSIS — R2689 Other abnormalities of gait and mobility: Secondary | ICD-10-CM | POA: Diagnosis present

## 2022-07-23 DIAGNOSIS — R262 Difficulty in walking, not elsewhere classified: Secondary | ICD-10-CM | POA: Diagnosis present

## 2022-07-23 DIAGNOSIS — R29898 Other symptoms and signs involving the musculoskeletal system: Secondary | ICD-10-CM | POA: Diagnosis present

## 2022-07-27 ENCOUNTER — Ambulatory Visit: Payer: Medicaid Other | Attending: Orthopaedic Surgery | Admitting: Physical Therapy

## 2022-07-27 DIAGNOSIS — R2681 Unsteadiness on feet: Secondary | ICD-10-CM | POA: Insufficient documentation

## 2022-07-27 DIAGNOSIS — M25672 Stiffness of left ankle, not elsewhere classified: Secondary | ICD-10-CM | POA: Insufficient documentation

## 2022-07-27 DIAGNOSIS — M25572 Pain in left ankle and joints of left foot: Secondary | ICD-10-CM | POA: Diagnosis present

## 2022-07-27 DIAGNOSIS — R29898 Other symptoms and signs involving the musculoskeletal system: Secondary | ICD-10-CM | POA: Insufficient documentation

## 2022-07-27 DIAGNOSIS — R262 Difficulty in walking, not elsewhere classified: Secondary | ICD-10-CM | POA: Insufficient documentation

## 2022-07-27 DIAGNOSIS — R2689 Other abnormalities of gait and mobility: Secondary | ICD-10-CM | POA: Insufficient documentation

## 2022-07-27 NOTE — Therapy (Addendum)
OUTPATIENT PHYSICAL THERAPY TREATMENT   Patient Name: Jasmine Mcdaniel MRN: [id number] DOB:Dec 25, 1975, 46 y.o., female Today's Date: 07/27/2022   PT End of Session - 07/27/22 1010     Visit Number 2    Number of Visits 17    Date for PT Re-Evaluation 09/17/22    Authorization Type [employer]    Authorization Time Period 07/27/22 - 01/23/23   Authorization - Visit Number 1   Authorization - Number of Visits 3   PT Start Time 1010    PT Stop Time 1104    PT Time Calculation (min) 54 min    Activity Tolerance Patient tolerated treatment well    Behavior During Therapy WFL for tasks assessed/performed              Past Medical History:  Diagnosis Date   GERD (gastroesophageal reflux disease)    History of migraine headaches    Hypertension    Past Surgical History:  Procedure Laterality Date   LEG SURGERY Left    ORIF ANKLE FRACTURE Left 05/25/2022   Procedure: OPEN REDUCTION INTERNAL FIXATION (ORIF) LEFT LATERAL MALLEOLUS ANKLE FRACTURE;  Surgeon: Mcarthur Rossetti, MD;  Location: [facility];  Service: Orthopedics;  Laterality: Left;   WISDOM TOOTH EXTRACTION     Patient Active Problem List   Diagnosis Date Noted   Closed left ankle fracture 05/25/2022   Closed displaced fracture of lateral malleolus of left fibula 05/23/2022    PCP: Virginia Rochester, PA  REFERRING PROVIDER: Mcarthur Rossetti, MD  REFERRING DIAG: [phone] (ICD-10-CM) - S/P ORIF (open reduction internal fixation) fracture  THERAPY DIAG:  Pain in left ankle and joints of left foot  Stiffness of left ankle, not elsewhere classified  Other abnormalities of gait and mobility  Difficulty in walking, not elsewhere classified  Unsteadiness on feet  Other symptoms and signs involving the musculoskeletal system  RATIONALE FOR EVALUATION AND TREATMENT: Rehabilitation  ONSET DATE: 05/25/2022 - ORIF left lateral malleolus ankle fracture   NEXT MD VISIT: 08/13/22   SUBJECTIVE:    SUBJECTIVE STATEMENT: Pt reports she had a hard time getting her feet on the floor this morning and thinks her foot seems more sensitive to the weather. The pain also seems to cause her BP to elevate.  PAIN:  Are you having pain? Yes: NPRS scale:  8/10 Pain location: medial and lateral L ankle Pain description: throbbing, pulsing Aggravating factors: walking Relieving factors: ice, elevation  PERTINENT HISTORY: 05/25/2022 - ORIF left lateral malleolus ankle fracture (05/20/22); HTN, migraines, GERD, LBP  PRECAUTIONS: None  WEIGHT BEARING RESTRICTIONS: Yes WBAT on R  FALLS:  Has patient fallen in last 6 months? Yes. 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  PATIENT SURVEYS:  LEFS 32 / 80 = 40.0 %  (as of 07/27/22)  COGNITION: Overall cognitive status: Within functional limits for tasks assessed     SENSATION: WFL Except numbness and occasional itching or burning along surgical incision  EDEMA:  Significant imprint visible from lace-up ankle brace with proximal and distal mild/mod edema.  MUSCLE LENGTH: Hamstrings: very mild tightness B ITB: mild tight  B Piriformis: mod tight B Hip flexors: mild tight B Quads: mild tight B Heelcord: mild tight on R mod/severe tight on L  POSTURE:  B pes planus  PALPATION: Decreased scar mobility L lateral ankle surgical incision  LOWER EXTREMITY ROM:  Active ROM Right eval Left eval  Ankle dorsiflexion +9 -5  Ankle plantarflexion 45 28  Ankle inversion 36 11  Ankle eversion 25 5    Passive ROM Left eval  Ankle dorsiflexion -2  Ankle plantarflexion 34  Ankle inversion 14  Ankle eversion 11   (Blank rows = not tested)  LOWER EXTREMITY MMT:  MMT Right eval Left eval  Hip flexion 4+ 4  Hip extension 5 4+  Hip abduction 4+ 4  Hip adduction 4 4-  Hip internal rotation 4+ 4+  Hip external rotation 4 3+  Knee flexion 5 4-  Knee extension 5 4  Ankle dorsiflexion 5 2+  Ankle plantarflexion  3-  Ankle inversion 5 2+  Ankle eversion 5 2+   (Blank rows = not tested)  GAIT: Distance walked: 60 ft Assistive device utilized: Single point cane and None Level of assistance: Complete Independence and Modified independence Gait pattern: decreased step length- Right, decreased stance time- Left, decreased ankle dorsiflexion- Left, and antalgic Comments: pt limping upon arrival w/o AD - somewhat better with SPC but still with decreased wt shift to L and decreased R step length   TODAY'S TREATMENT:  07/27/22 THERAPEUTIC EXERCISE: to improve flexibility, strength and mobility.  Verbal and tactile cues throughout for technique. NuStep - L3 x 5 min  (VS after warm-up:  BP = 148/96, HR = 88; O2 sats = 98%) Longsitting L gastroc stretch with strap 2 x 30" Longsitting L soleus stretch with strap 2 x 30" Longsitting L ankle pumps x 20 Longsitting L  ankle circles CW/CCW 2 x 10 each  Longsitting L ankle ABCs x 2 sets (1 set each uppercase & lowercase) Seated B arch lifts 2 x 10 Seated B heel-toe raises 2 x 10 Seated L ankle inversion/eversion AROM x 10 Side-sitting in figure-4 L ankle peroneal  stretch 2 x 30" Side-sitting in figure-4 L ankle inversion/eversion PROM 2 x 10  MODALITIES: [employer] [place] vasopneumatic compression post session to elevated L ankle x 10 min, medium compression, 34 deg to reduce post-exercise pain and swelling/edema   07/23/22 THERAPEUTIC EXERCISE:Instruction in initial HEP (see below) to improve flexibility, strength and mobility.  Verbal and tactile cues throughout for technique.  SELF CARE: Provided instruction in incisional scar massage     PATIENT EDUCATION:  Education details: PT eval findings, anticipated POC, initial HEP, and incisional scar massage Person educated: Patient Education method: Explanation, Demonstration, Tactile cues, Verbal cues, and Handouts Education comprehension: verbalized understanding, returned demonstration, verbal cues required, tactile cues required, and needs further education   HOME EXERCISE PROGRAM: Access Code: [email or web address] URL: [email or web address] Date: 07/23/2022 Prepared by: Glenetta Hew  Exercises - Long Sitting Calf Stretch with Strap  - 2-3 x daily - 7 x weekly - 3 reps - 30 sec hold -  Long Sitting Soleus Stretch on Bolster with Strap (Mirrored)  - 2-3 x daily - 7 x weekly - 3 reps - 30 sec hold - Long Sitting Ankle Pumps (Mirrored)  - 2-3 x daily - 7 x weekly - 2 sets - 10 reps - 3 sec hold - Supine Ankle Circles  - 2-3 x daily - 7 x weekly - 2 sets - 10 reps  Patient Education - Scar Massage  ASSESSMENT:  CLINICAL IMPRESSION: Kegan reports no issues with HEP but on review, clarifications necessary for soleus stretch positioning and direction of movement. Progressed ROM exercises with ankle ABCs and introduced minimal weight bearing AROM/strengthening with arch lift and heel/toe raises. Increased stiffness/discomfort in lateral lower leg noted with inversion, therefore instructed pt in inversion stretch and PROM to help promote increased flexibility in peroneals. Session concluded with  GR vasopneumatic compression to reduce post-exercise pain and edema.  OBJECTIVE IMPAIRMENTS: Abnormal gait, decreased activity tolerance, decreased balance, decreased coordination, decreased endurance, decreased knowledge of condition, decreased knowledge of use of DME, decreased mobility, difficulty walking, decreased ROM, decreased strength, decreased safety awareness, increased edema, increased fascial restrictions, impaired perceived functional ability, increased muscle spasms, impaired flexibility, impaired sensation, improper body mechanics, postural dysfunction, and pain.   ACTIVITY LIMITATIONS: standing, squatting, stairs, transfers, and locomotion level  PARTICIPATION LIMITATIONS: meal prep, cleaning, laundry, driving, shopping, community activity, occupation, and yard work  PERSONAL FACTORS: Past/current experiences, Profession, Time since onset of injury/illness/exacerbation, and 3+ comorbidities: HTN, migraines, GERD, LBP  are also affecting patient's functional outcome.   REHAB POTENTIAL: Good  CLINICAL DECISION MAKING: Stable/uncomplicated  EVALUATION COMPLEXITY: Low   GOALS: Goals reviewed with patient? Yes  SHORT TERM GOALS: Target date: 08/20/2022   Patient will be independent with initial HEP. Baseline: Initial HEP provided on eval Goal status: IN PROGRESS  2.  Patient will demonstrate improved L ankle DF AROM to >/= neutral to improve gait mechanics. Baseline: Refer to above AROM chart Goal status: IN PROGRESS  3.  Patient will ambulate with LRAD demonstrating step-through pattern and improved gait mechanics including normal heel-toe progression on L w/o increased L ankle pain. Baseline: Antalgic gait with decreased weight shift to L, decreased stance time on L and decreased R step length Goal status: IN PROGRESS   LONG TERM GOALS: Target date: 09/17/2022   Patient will be independent with advanced/ongoing HEP to improve outcomes and carryover.  Baseline:  Initial HEP provided on eval Goal status: IN PROGRESS  2.  Patient will report at least 75% improvement in L ankle pain to improve QOL. Baseline: 9/10 Goal status: IN PROGRESS  3.  Patient will demonstrate improved L ankle AROM to [facility] with at least 10 DF to allow for normal gait and stair mechanics. Baseline: Refer to above AROM chart - AROM DF lacking 5 from neutral Goal status: IN PROGRESS  4.  Patient will demonstrate improved B LE strength to >/= 4+/5 with L ankle PF at least 3/5 for improved stability and ease of mobility . Baseline: Refer to above MMT chart Goal status: IN PROGRESS  5.  Patient will be able to ambulate 600' with LRAD and normal gait pattern without increased foot/ankle pain to access community.  Baseline: Antalgic gait with decreased weight shift to L, decreased stance time on L and decreased R step length Goal status: IN PROGRESS  6. Patient will be able to ascend/descend stairs with 1 HR and reciprocal step pattern safely to access home and community.  Baseline: NT on eval Goal  status: IN PROGRESS  7.  Patient will report at least 9 point improvement on LEFS to demonstrate improved functional ability. Baseline: 32 / 80 = 40.0 % Goal status: IN PROGRESS  8.  Patient will demonstrate at least 19/24 on DGI to decrease risk of falls. Baseline: NT on eval Goal status: IN PROGRESS    PLAN: PT FREQUENCY: 2x/week  PT DURATION: 8 weeks  PLANNED INTERVENTIONS: Therapeutic exercises, Therapeutic activity, Neuromuscular re-education, Balance training, Gait training, Patient/Family education, Self Care, Joint mobilization, Stair training, DME instructions, Dry Needling, Electrical stimulation, Cryotherapy, Moist heat, Taping, Vasopneumatic device, Ultrasound, Ionotophoresis 4mg /ml Dexamethasone, Manual therapy, and Re-evaluation  PLAN FOR NEXT SESSION: review gait training with [place] or LRAD to normalize gait pattern; gently progress L ankle ROM and strengthening -  update HEP (MedBridge not working last visit)   Percival Spanish, PT 07/27/2022, 1:53 PM

## 2022-07-30 ENCOUNTER — Ambulatory Visit: Payer: Medicaid Other

## 2022-07-30 DIAGNOSIS — M25672 Stiffness of left ankle, not elsewhere classified: Secondary | ICD-10-CM

## 2022-07-30 DIAGNOSIS — R29898 Other symptoms and signs involving the musculoskeletal system: Secondary | ICD-10-CM

## 2022-07-30 DIAGNOSIS — R262 Difficulty in walking, not elsewhere classified: Secondary | ICD-10-CM

## 2022-07-30 DIAGNOSIS — M25572 Pain in left ankle and joints of left foot: Secondary | ICD-10-CM | POA: Diagnosis not present

## 2022-07-30 DIAGNOSIS — R2681 Unsteadiness on feet: Secondary | ICD-10-CM

## 2022-07-30 DIAGNOSIS — R2689 Other abnormalities of gait and mobility: Secondary | ICD-10-CM

## 2022-07-30 NOTE — Therapy (Signed)
OUTPATIENT PHYSICAL THERAPY TREATMENT   Patient Name: Jasmine Mcdaniel MRN: [id number] DOB:12-Jan-1976, 46 y.o., female Today's Date: 07/30/2022   PT End of Session - 07/30/22 1534     Visit Number 3    Number of Visits 17    Date for PT Re-Evaluation 09/17/22    Authorization Type [employer]    PT Start Time 1454   pt late   PT Stop Time 1529    PT Time Calculation (min) 35 min    Activity Tolerance Patient tolerated treatment well    Behavior During Therapy WFL for tasks assessed/performed               Past Medical History:  Diagnosis Date   GERD (gastroesophageal reflux disease)    History of migraine headaches    Hypertension    Past Surgical History:  Procedure Laterality Date   LEG SURGERY Left    ORIF ANKLE FRACTURE Left 05/25/2022   Procedure: OPEN REDUCTION INTERNAL FIXATION (ORIF) LEFT LATERAL MALLEOLUS ANKLE FRACTURE;  Surgeon: Kathryne Hitch, MD;  Location: [facility];  Service: Orthopedics;  Laterality: Left;   WISDOM TOOTH EXTRACTION     Patient Active Problem List   Diagnosis Date Noted   Closed left ankle fracture 05/25/2022   Closed displaced fracture of lateral malleolus of left fibula 05/23/2022    PCP: Daylene Katayama, PA  REFERRING PROVIDER: Kathryne Hitch, MD  REFERRING DIAG: [phone] (ICD-10-CM) - S/P ORIF (open reduction internal fixation) fracture  THERAPY DIAG:  Pain in left ankle and joints of left foot  Stiffness of left ankle, not elsewhere classified  Other abnormalities of gait and mobility  Difficulty in walking, not elsewhere classified  Unsteadiness on feet  Other symptoms and signs involving the musculoskeletal system  RATIONALE FOR EVALUATION AND TREATMENT: Rehabilitation  ONSET DATE: 05/25/2022 - ORIF left lateral malleolus ankle fracture   NEXT MD VISIT: 08/13/22   SUBJECTIVE:   SUBJECTIVE STATEMENT: Pain is better today, have not checked BP today but it seems to be doing better  today.  PAIN:  Are you having pain? Yes: NPRS scale: 6/10 Pain location: medial and lateral L ankle Pain description: throbbing,  Aggravating factors: walking Relieving factors: ice, elevation  PERTINENT HISTORY: 05/25/2022 - ORIF left lateral malleolus ankle fracture (05/20/22); HTN, migraines, GERD, LBP  PRECAUTIONS: None  WEIGHT BEARING RESTRICTIONS: Yes WBAT on R  FALLS:  Has patient fallen in last 6 months? Yes. 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  PATIENT SURVEYS:  LEFS 32 / 80 = 40.0 %  (as of 07/27/22)  COGNITION: Overall cognitive status: Within functional limits for tasks assessed     SENSATION: [facility]  Except numbness and occasional itching or burning along surgical incision  EDEMA:  Significant imprint visible from lace-up ankle brace with proximal and distal mild/mod edema.  MUSCLE LENGTH: Hamstrings: very mild tightness B ITB: mild tight B Piriformis: mod tight B Hip flexors: mild tight B Quads: mild tight B Heelcord: mild tight on R mod/severe tight on L  POSTURE:  B pes planus  PALPATION: Decreased scar mobility L lateral ankle  surgical incision  LOWER EXTREMITY ROM:  Active ROM Right eval Left eval  Ankle dorsiflexion +9 -5  Ankle plantarflexion 45 28  Ankle inversion 36 11  Ankle eversion 25 5    Passive ROM Left eval  Ankle dorsiflexion -2  Ankle plantarflexion 34  Ankle inversion 14  Ankle eversion 11   (Blank rows = not tested)  LOWER EXTREMITY MMT:  MMT Right eval Left eval  Hip flexion 4+ 4  Hip extension 5 4+  Hip abduction 4+ 4  Hip adduction 4 4-  Hip internal rotation 4+ 4+  Hip external rotation 4 3+  Knee flexion 5 4-  Knee extension 5 4  Ankle dorsiflexion 5 2+  Ankle plantarflexion  3-  Ankle inversion 5 2+  Ankle eversion 5 2+   (Blank rows = not tested)  GAIT: Distance walked: 60 ft Assistive device utilized: Single point cane and None Level of assistance: Complete Independence and Modified independence Gait pattern: decreased step length- Right, decreased stance time- Left, decreased ankle dorsiflexion- Left, and antalgic Comments: pt limping upon arrival w/o AD - somewhat better with SPC but still with decreased wt shift to L and decreased R step length   TODAY'S TREATMENT: 07/30/22 Therapeutic Exercise: Nustep L4x45min   Ankle PF/DF w/ yellow TB x 10  Seated ankle EV/IV no resistance x 10 Gastroc stretch w/ strap 2 x 30 sec  Gait Training: 93ft x2 w/ [facility]  07/27/22 THERAPEUTIC EXERCISE: to improve flexibility, strength and mobility.  Verbal and tactile cues throughout for technique. NuStep - L3 x 5 min  (VS after warm-up:  BP = 148/96, HR = 88; O2 sats = 98%) Longsitting L gastroc stretch with strap 2 x 30" Longsitting L soleus stretch with strap 2 x 30" Longsitting L ankle pumps x 20 Longsitting L  ankle circles CW/CCW 2 x 10 each  Longsitting L ankle ABCs x 2 sets (1 set each uppercase & lowercase) Seated B arch lifts 2 x 10 Seated B heel-toe raises 2 x 10 Seated L ankle inversion/eversion AROM x 10 Side-sitting in figure-4 L ankle peroneal stretch 2 x  30" Side-sitting in figure-4 L ankle inversion/eversion PROM 2 x 10  MODALITIES: Game Ready vasopneumatic compression post session to elevated L ankle x 10 min, medium compression, 34 deg to reduce post-exercise pain and swelling/edema   07/23/22 THERAPEUTIC EXERCISE:Instruction in initial HEP (see below) to improve flexibility, strength and mobility.  Verbal and tactile cues throughout for technique.  SELF CARE: Provided instruction in incisional scar massage     PATIENT EDUCATION:  Education details: PT eval findings, anticipated POC, initial HEP, and incisional scar massage Person educated: Patient Education method: Explanation, Demonstration, Tactile cues, Verbal cues, and Handouts Education comprehension: verbalized understanding, returned demonstration, verbal cues required, tactile cues required, and needs further education   HOME EXERCISE PROGRAM: Access Code: [email or web address] URL: [email or web address] Date: 07/23/2022 Prepared by: Glenetta Hew  Exercises - Long Sitting Calf Stretch with Strap  - 2-3 x daily - 7 x weekly - 3 reps - 30 sec hold - Long  Sitting Soleus Stretch on Bolster with Strap (Mirrored)  - 2-3 x daily - 7 x weekly - 3 reps - 30 sec hold - Long Sitting Ankle Pumps (Mirrored)  - 2-3 x daily - 7 x weekly - 2 sets - 10 reps - 3 sec hold - Supine Ankle Circles  - 2-3 x daily - 7 x weekly - 2 sets - 10 reps  Patient Education - Scar Massage  ASSESSMENT:  CLINICAL IMPRESSION: Pt arrived late to session. Progressed ankle strengthening but pt was unable to do EV/IV w/ TB d/t weakness. Reviewed gait with cane, with her demo good sequence but antalgic on L. Cuing provided throughout session for form and technique, plan to continue progressing strength next visit.  OBJECTIVE IMPAIRMENTS: Abnormal gait, decreased activity tolerance, decreased balance, decreased coordination, decreased endurance, decreased knowledge of condition, decreased knowledge of use  of DME, decreased mobility, difficulty walking, decreased ROM, decreased strength, decreased safety awareness, increased edema, increased fascial restrictions, impaired perceived functional ability, increased muscle spasms, impaired flexibility, impaired sensation, improper body mechanics, postural dysfunction, and pain.   ACTIVITY LIMITATIONS: standing, squatting, stairs, transfers, and locomotion level  PARTICIPATION LIMITATIONS: meal prep, cleaning, laundry, driving, shopping, community activity, occupation, and yard work  PERSONAL FACTORS: Past/current experiences, Profession, Time since onset of injury/illness/exacerbation, and 3+ comorbidities: HTN, migraines, GERD, LBP  are also affecting patient's functional outcome.   REHAB POTENTIAL: Good  CLINICAL DECISION MAKING: Stable/uncomplicated  EVALUATION COMPLEXITY: Low   GOALS: Goals reviewed with patient? Yes  SHORT TERM GOALS: Target date: 08/20/2022   Patient will be independent with initial HEP. Baseline: Initial HEP provided on eval Goal status: IN PROGRESS  2.  Patient will demonstrate improved L ankle DF AROM to >/= neutral to improve gait mechanics. Baseline: Refer to above AROM chart Goal status: IN PROGRESS  3.  Patient will ambulate with LRAD demonstrating step-through pattern and improved gait mechanics including normal heel-toe progression on L w/o increased L ankle pain. Baseline: Antalgic gait with decreased weight shift to L, decreased stance time on L and decreased R step length Goal status: IN PROGRESS   LONG TERM GOALS: Target date: 09/17/2022   Patient will be independent with advanced/ongoing HEP to improve outcomes and carryover.  Baseline: Initial HEP provided on eval Goal status: IN PROGRESS  2.  Patient will report at least 75% improvement in L ankle pain to improve QOL. Baseline: 9/10 Goal status: IN PROGRESS  3.  Patient will demonstrate improved L ankle AROM to [facility] with at least 10 DF to allow  for normal gait and stair mechanics. Baseline: Refer to above AROM chart - AROM DF lacking 5 from neutral Goal status: IN PROGRESS  4.  Patient will demonstrate improved B LE strength to >/= 4+/5 with L ankle PF at least 3/5 for improved stability and ease of mobility . Baseline: Refer to above MMT chart Goal status: IN PROGRESS  5.  Patient will be able to ambulate 600' with LRAD and normal gait pattern without increased foot/ankle pain to access community.  Baseline: Antalgic gait with decreased weight shift to L, decreased stance time on L and decreased R step length Goal status: IN PROGRESS  6. Patient will be able to ascend/descend stairs with 1 HR and reciprocal step pattern safely to access home and community.  Baseline: NT on eval Goal status: IN PROGRESS  7.  Patient will report at least 9 point improvement on LEFS to demonstrate improved functional ability. Baseline: 32 / 80 = 40.0 %  Goal status: IN PROGRESS  8.  Patient will demonstrate at least 19/24 on DGI to decrease risk of falls. Baseline: NT on eval Goal status: IN PROGRESS    PLAN: PT FREQUENCY: 2x/week  PT DURATION: 8 weeks  PLANNED INTERVENTIONS: Therapeutic exercises, Therapeutic activity, Neuromuscular re-education, Balance training, Gait training, Patient/Family education, Self Care, Joint mobilization, Stair training, DME instructions, Dry Needling, Electrical stimulation, Cryotherapy, Moist heat, Taping, Vasopneumatic device, Ultrasound, Ionotophoresis 4mg /ml Dexamethasone, Manual therapy, and Re-evaluation  PLAN FOR NEXT SESSION: review gait training with SPC or LRAD to normalize gait pattern; gently progress L ankle ROM and strengthening - update HEP (MedBridge not working last visit)   , PTA 07/30/2022, 3:37 PM

## 2022-07-31 ENCOUNTER — Telehealth: Payer: Self-pay | Admitting: Orthopaedic Surgery

## 2022-07-31 ENCOUNTER — Other Ambulatory Visit: Payer: Self-pay | Admitting: Physician Assistant

## 2022-07-31 MED ORDER — HYDROCODONE-ACETAMINOPHEN 7.5-325 MG PO TABS: 1.0000 | ORAL_TABLET | Freq: Four times a day (QID) | ORAL | 0 refills | Status: DC | PRN

## 2022-07-31 NOTE — Telephone Encounter (Signed)
Please advise 

## 2022-07-31 NOTE — Telephone Encounter (Signed)
Pt called requesting a refill of pain medication. Please send to pharmacy on file. Pt phone number is [phone].

## 2022-08-01 ENCOUNTER — Ambulatory Visit: Payer: Medicaid Other

## 2022-08-06 ENCOUNTER — Ambulatory Visit: Payer: Medicaid Other

## 2022-08-06 VITALS — BP 142/80

## 2022-08-06 DIAGNOSIS — R2681 Unsteadiness on feet: Secondary | ICD-10-CM

## 2022-08-06 DIAGNOSIS — M25572 Pain in left ankle and joints of left foot: Secondary | ICD-10-CM

## 2022-08-06 DIAGNOSIS — R262 Difficulty in walking, not elsewhere classified: Secondary | ICD-10-CM

## 2022-08-06 DIAGNOSIS — R2689 Other abnormalities of gait and mobility: Secondary | ICD-10-CM

## 2022-08-06 DIAGNOSIS — M25672 Stiffness of left ankle, not elsewhere classified: Secondary | ICD-10-CM

## 2022-08-06 DIAGNOSIS — R29898 Other symptoms and signs involving the musculoskeletal system: Secondary | ICD-10-CM

## 2022-08-06 NOTE — Therapy (Signed)
OUTPATIENT PHYSICAL THERAPY TREATMENT   Patient Name: Jasmine Mcdaniel MRN: [id number] DOB:14-Jun-1976, 46 y.o., female Today's Date: 08/06/2022   PT End of Session - 08/06/22 1618     Visit Number 4    Number of Visits 17    Date for PT Re-Evaluation 09/17/22    Authorization Type [employer]    PT Start Time 1549   pt late   PT Stop Time 1615    PT Time Calculation (min) 26 min    Activity Tolerance Patient tolerated treatment well    Behavior During Therapy WFL for tasks assessed/performed                Past Medical History:  Diagnosis Date   GERD (gastroesophageal reflux disease)    History of migraine headaches    Hypertension    Past Surgical History:  Procedure Laterality Date   LEG SURGERY Left    ORIF ANKLE FRACTURE Left 05/25/2022   Procedure: OPEN REDUCTION INTERNAL FIXATION (ORIF) LEFT LATERAL MALLEOLUS ANKLE FRACTURE;  Surgeon: Mcarthur Rossetti, MD;  Location: [facility];  Service: Orthopedics;  Laterality: Left;   WISDOM TOOTH EXTRACTION     Patient Active Problem List   Diagnosis Date Noted   Closed left ankle fracture 05/25/2022   Closed displaced fracture of lateral malleolus of left fibula 05/23/2022    PCP: Virginia Rochester, PA  REFERRING PROVIDER: Mcarthur Rossetti, MD  REFERRING DIAG: [phone] (ICD-10-CM) - S/P ORIF (open reduction internal fixation) fracture  THERAPY DIAG:  Pain in left ankle and joints of left foot  Stiffness of left ankle, not elsewhere classified  Other abnormalities of gait and mobility  Difficulty in walking, not elsewhere classified  Unsteadiness on feet  Other symptoms and signs involving the musculoskeletal system  RATIONALE FOR EVALUATION AND TREATMENT: Rehabilitation  ONSET DATE: 05/25/2022 - ORIF left lateral malleolus ankle fracture   NEXT MD VISIT: 08/13/22   SUBJECTIVE:   SUBJECTIVE STATEMENT: Pt reports some swelling in L ankle but walking w/o brace now.   PAIN:   Are you having pain? No  PERTINENT HISTORY: 05/25/2022 - ORIF left lateral malleolus ankle fracture (05/20/22); HTN, migraines, GERD, LBP  PRECAUTIONS: None  WEIGHT BEARING RESTRICTIONS: Yes WBAT on R  FALLS:  Has patient fallen in last 6 months? Yes. 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  PATIENT SURVEYS:  LEFS 32 / 80 = 40.0 %  (as of 07/27/22)  COGNITION: Overall cognitive status: Within functional limits for tasks assessed     SENSATION: WFL Except numbness and occasional itching or burning along surgical incision  EDEMA:  Significant imprint visible from lace-up ankle brace with proximal and distal  mild/mod edema.  MUSCLE LENGTH: Hamstrings: very mild tightness B ITB: mild tight B Piriformis: mod tight B Hip flexors: mild tight B Quads: mild tight B Heelcord: mild tight on R mod/severe tight on L  POSTURE:  B pes planus  PALPATION: Decreased scar mobility L lateral ankle surgical incision  LOWER EXTREMITY ROM:  Active ROM Right eval Left eval  Ankle dorsiflexion +9 -5  Ankle plantarflexion 45 28  Ankle inversion 36 11  Ankle eversion 25  5    Passive ROM Left eval  Ankle dorsiflexion -2  Ankle plantarflexion 34  Ankle inversion 14  Ankle eversion 11   (Blank rows = not tested)  LOWER EXTREMITY MMT:  MMT Right eval Left eval  Hip flexion 4+ 4  Hip extension 5 4+  Hip abduction 4+ 4  Hip adduction 4 4-  Hip internal rotation 4+ 4+  Hip external rotation 4 3+  Knee flexion 5 4-  Knee extension 5 4  Ankle dorsiflexion 5 2+  Ankle plantarflexion  3-  Ankle inversion 5 2+  Ankle eversion 5 2+   (Blank rows = not tested)  GAIT: Distance walked: 60 ft Assistive device utilized: Single point cane and None Level of assistance: Complete Independence and Modified independence Gait pattern: decreased step length- Right, decreased stance time- Left, decreased ankle dorsiflexion- Left, and antalgic Comments: pt limping upon arrival w/o AD - somewhat better with SPC but still with decreased wt shift to L and decreased R step length   TODAY'S TREATMENT: 08/06/22 Therapeutic Exercise: Nustep L5x64mn Seated 4 way ankle w/ yellow TB x 10  Gait Training: 180 ft - w/o SPC - antalgic, decreased step length on R, decreased stance on L  07/30/22 Therapeutic Exercise: Nustep L4x570m   Ankle PF/DF w/ yellow TB x 10  Seated ankle EV/IV no resistance x 10 Gastroc stretch w/ strap 2 x 30 sec  Gait Training: [id number] w/ [facility]/4/23 THERAPEUTIC EXERCISE: to improve flexibility, strength and mobility.  Verbal and tactile cues throughout for technique. NuStep - L3 x 5 min  (VS after warm-up:  BP = 148/96, HR = 88; O2 sats = 98%) Longsitting L gastroc stretch with strap 2 x 30" Longsitting L soleus stretch with strap 2 x 30" Longsitting L ankle pumps x 20 Longsitting L  ankle circles CW/CCW 2 x 10 each  Longsitting L ankle ABCs x 2 sets (1 set each uppercase & lowercase) Seated B arch lifts 2 x 10 Seated B heel-toe raises 2 x 10 Seated L ankle inversion/eversion AROM x 10 Side-sitting in figure-4 L ankle peroneal  stretch 2 x 30" Side-sitting in figure-4 L ankle inversion/eversion PROM 2 x 10  MODALITIES: Game Ready vasopneumatic compression post session to elevated L ankle x 10 min, medium compression, 34 deg to reduce post-exercise pain and swelling/edema   07/23/22 THERAPEUTIC EXERCISE:Instruction in initial HEP (see below) to improve flexibility, strength and mobility.  Verbal and tactile cues throughout for technique.  SELF CARE: Provided instruction in incisional scar massage     PATIENT EDUCATION:  Education details: PT eval findings, anticipated POC, initial HEP, and incisional scar massage Person educated: Patient Education method: Explanation, Demonstration, Tactile cues, Verbal cues, and Handouts Education comprehension: verbalized understanding, returned demonstration, verbal cues required, tactile cues required, and needs further education   HOME EXERCISE PROGRAM: Access Code: [email or web address] URL: [email or web address] Date: 07/23/2022 Prepared by: JoAAnnie Parasxercises - Long Sitting Calf Stretch with Strap  - 2-3 x daily - 7 x  weekly - 3 reps - 30 sec hold - Long Sitting Soleus Stretch on Bolster with Strap (Mirrored)  - 2-3 x daily - 7 x weekly - 3 reps - 30 sec hold - Long Sitting Ankle Pumps (Mirrored)  - 2-3 x daily - 7 x weekly - 2 sets - 10 reps - 3 sec hold - Supine Ankle Circles  - 2-3 x daily - 7 x weekly - 2 sets - 10 reps  Patient Education - Scar Massage  ASSESSMENT:  CLINICAL IMPRESSION: Pt arrived late to session stating that she has issues at work and got caught in traffic. She asked to check BP today, which was elevated on the systolic number but pt was not symptomatic. Updated HEP w/ ankle TB strengthening, deferred IV w/ TB d/t patient not being able to complete full ROM. She has now MET STG for HEP.   OBJECTIVE IMPAIRMENTS: Abnormal gait, decreased activity tolerance, decreased balance, decreased coordination, decreased endurance, decreased  knowledge of condition, decreased knowledge of use of DME, decreased mobility, difficulty walking, decreased ROM, decreased strength, decreased safety awareness, increased edema, increased fascial restrictions, impaired perceived functional ability, increased muscle spasms, impaired flexibility, impaired sensation, improper body mechanics, postural dysfunction, and pain.   ACTIVITY LIMITATIONS: standing, squatting, stairs, transfers, and locomotion level  PARTICIPATION LIMITATIONS: meal prep, cleaning, laundry, driving, shopping, community activity, occupation, and yard work  PERSONAL FACTORS: Past/current experiences, Profession, Time since onset of injury/illness/exacerbation, and 3+ comorbidities: HTN, migraines, GERD, LBP  are also affecting patient's functional outcome.   REHAB POTENTIAL: Good  CLINICAL DECISION MAKING: Stable/uncomplicated  EVALUATION COMPLEXITY: Low   GOALS: Goals reviewed with patient? Yes  SHORT TERM GOALS: Target date: 08/20/2022   Patient will be independent with initial HEP. Baseline: Initial HEP provided on eval Goal status: MET - 08/06/22  2.  Patient will demonstrate improved L ankle DF AROM to >/= neutral to improve gait mechanics. Baseline: Refer to above AROM chart Goal status: IN PROGRESS  3.  Patient will ambulate with LRAD demonstrating step-through pattern and improved gait mechanics including normal heel-toe progression on L w/o increased L ankle pain. Baseline: Antalgic gait with decreased weight shift to L, decreased stance time on L and decreased R step length Goal status: IN PROGRESS    LONG TERM GOALS: Target date: 09/17/2022   Patient will be independent with advanced/ongoing HEP to improve outcomes and carryover.  Baseline: Initial HEP provided on eval Goal status: IN PROGRESS  2.  Patient will report at least 75% improvement in L ankle pain to improve QOL. Baseline: 9/10 Goal status: IN PROGRESS  3.  Patient will demonstrate  improved L ankle AROM to [facility] with at least 10 DF to allow for normal gait and stair mechanics. Baseline: Refer to above AROM chart - AROM DF lacking 5 from neutral Goal status: IN PROGRESS  4.  Patient will demonstrate improved B LE strength to >/= 4+/5 with L ankle PF at least 3/5 for improved stability and ease of mobility . Baseline: Refer to above MMT chart Goal status: IN PROGRESS  5.  Patient will be able to ambulate 600' with LRAD and normal gait pattern without increased foot/ankle pain to access community.  Baseline: Antalgic gait with decreased weight shift to L, decreased stance time on L and decreased R step length Goal status: IN PROGRESS  6. Patient will be able to ascend/descend stairs with 1 HR and reciprocal step pattern safely to access home and community.  Baseline: NT on eval Goal  status: IN PROGRESS  7.  Patient will report at least 9 point improvement on LEFS to demonstrate improved functional ability. Baseline: 32 / 80 = 40.0 % Goal status: IN PROGRESS  8.  Patient will demonstrate at least 19/24 on DGI to decrease risk of falls. Baseline: NT on eval Goal status: IN PROGRESS    PLAN: PT FREQUENCY: 2x/week  PT DURATION: 8 weeks  PLANNED INTERVENTIONS: Therapeutic exercises, Therapeutic activity, Neuromuscular re-education, Balance training, Gait training, Patient/Family education, Self Care, Joint mobilization, Stair training, DME instructions, Dry Needling, Electrical stimulation, Cryotherapy, Moist heat, Taping, Vasopneumatic device, Ultrasound, Ionotophoresis 13m/ml Dexamethasone, Manual therapy, and Re-evaluation  PLAN FOR NEXT SESSION: try to wean off of AD w/ gait; gently progress L ankle ROM and strengthening - update HEP (MedBridge not working last visit)   BArtist Pais PTA 08/06/2022, 4:19 PM

## 2022-08-09 ENCOUNTER — Ambulatory Visit: Payer: Medicaid Other | Admitting: Physical Therapy

## 2022-08-13 ENCOUNTER — Ambulatory Visit: Payer: Medicaid Other

## 2022-08-13 ENCOUNTER — Encounter: Payer: Self-pay | Admitting: Orthopaedic Surgery

## 2022-08-13 ENCOUNTER — Ambulatory Visit (INDEPENDENT_AMBULATORY_CARE_PROVIDER_SITE_OTHER): Payer: Medicaid Other

## 2022-08-13 ENCOUNTER — Ambulatory Visit (INDEPENDENT_AMBULATORY_CARE_PROVIDER_SITE_OTHER): Payer: Medicaid Other | Admitting: Orthopaedic Surgery

## 2022-08-13 DIAGNOSIS — Z9889 Other specified postprocedural states: Secondary | ICD-10-CM

## 2022-08-13 DIAGNOSIS — R262 Difficulty in walking, not elsewhere classified: Secondary | ICD-10-CM

## 2022-08-13 DIAGNOSIS — Z8781 Personal history of (healed) traumatic fracture: Secondary | ICD-10-CM

## 2022-08-13 DIAGNOSIS — R2681 Unsteadiness on feet: Secondary | ICD-10-CM

## 2022-08-13 DIAGNOSIS — R2689 Other abnormalities of gait and mobility: Secondary | ICD-10-CM

## 2022-08-13 DIAGNOSIS — M25572 Pain in left ankle and joints of left foot: Secondary | ICD-10-CM

## 2022-08-13 DIAGNOSIS — S8262XD Displaced fracture of lateral malleolus of left fibula, subsequent encounter for closed fracture with routine healing: Secondary | ICD-10-CM

## 2022-08-13 DIAGNOSIS — M25672 Stiffness of left ankle, not elsewhere classified: Secondary | ICD-10-CM

## 2022-08-13 DIAGNOSIS — R29898 Other symptoms and signs involving the musculoskeletal system: Secondary | ICD-10-CM

## 2022-08-13 MED ORDER — HYDROCODONE-ACETAMINOPHEN 5-325 MG PO TABS: 1.0000 | ORAL_TABLET | Freq: Three times a day (TID) | ORAL | 0 refills | Status: DC | PRN

## 2022-08-13 NOTE — Therapy (Signed)
OUTPATIENT PHYSICAL THERAPY TREATMENT   Patient Name: Jasmine Mcdaniel MRN: [id number] DOB:1976/07/07, 46 y.o., female Today's Date: 08/13/2022   PT End of Session - 08/13/22 1159     Visit Number 5    Number of Visits 17    Date for PT Re-Evaluation 09/17/22    Authorization Type [employer]    PT Start Time 1105    PT Stop Time 1146    PT Time Calculation (min) 41 min    Activity Tolerance Patient tolerated treatment well    Behavior During Therapy WFL for tasks assessed/performed                 Past Medical History:  Diagnosis Date   GERD (gastroesophageal reflux disease)    History of migraine headaches    Hypertension    Past Surgical History:  Procedure Laterality Date   LEG SURGERY Left    ORIF ANKLE FRACTURE Left 05/25/2022   Procedure: OPEN REDUCTION INTERNAL FIXATION (ORIF) LEFT LATERAL MALLEOLUS ANKLE FRACTURE;  Surgeon: Mcarthur Rossetti, MD;  Location: [facility];  Service: Orthopedics;  Laterality: Left;   WISDOM TOOTH EXTRACTION     Patient Active Problem List   Diagnosis Date Noted   Closed left ankle fracture 05/25/2022   Closed displaced fracture of lateral malleolus of left fibula 05/23/2022    PCP: Virginia Rochester, PA  REFERRING PROVIDER: Mcarthur Rossetti, MD  REFERRING DIAG: [phone] (ICD-10-CM) - S/P ORIF (open reduction internal fixation) fracture  THERAPY DIAG:  Pain in left ankle and joints of left foot  Stiffness of left ankle, not elsewhere classified  Other abnormalities of gait and mobility  Difficulty in walking, not elsewhere classified  Unsteadiness on feet  Other symptoms and signs involving the musculoskeletal system  RATIONALE FOR EVALUATION AND TREATMENT: Rehabilitation  ONSET DATE: 05/25/2022 - ORIF left lateral malleolus ankle fracture   NEXT MD VISIT: 08/13/22   SUBJECTIVE:   SUBJECTIVE STATEMENT: Pt reports hurting on both sides of ankle, accompanied by some    PAIN:  Are you  having pain? Yes: NPRS scale: 8/10 Pain location: L ankle Pain description: throbbing  PERTINENT HISTORY: 05/25/2022 - ORIF left lateral malleolus ankle fracture (05/20/22); HTN, migraines, GERD, LBP  PRECAUTIONS: None  WEIGHT BEARING RESTRICTIONS: Yes WBAT on R  FALLS:  Has patient fallen in last 6 months? Yes. 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  PATIENT SURVEYS:  LEFS 32 / 80 = 40.0 %  (as of 07/27/22)  COGNITION: Overall cognitive status: Within functional limits for tasks assessed     SENSATION: WFL Except numbness and occasional itching or burning along surgical incision  EDEMA:  Significant imprint visible  from lace-up ankle brace with proximal and distal mild/mod edema.  MUSCLE LENGTH: Hamstrings: very mild tightness B ITB: mild tight B Piriformis: mod tight B Hip flexors: mild tight B Quads: mild tight B Heelcord: mild tight on R mod/severe tight on L  POSTURE:  B pes planus  PALPATION: Decreased scar mobility L lateral ankle surgical incision  LOWER EXTREMITY ROM:  Active ROM Right eval Left eval Left 08/13/22  Ankle dorsiflexion +9  -5 -1  Ankle plantarflexion 45 28 42  Ankle inversion 36 11 20  Ankle eversion _0 Passive ROM Left eval  Ankle dorsiflexion -2  Ankle plantarflexion 34  Ankle inversion 14  Ankle eversion 11   (Blank rows = not tested)  LOWER EXTREMITY MMT:  MMT Right eval Left eval  Hip flexion 4+ 4  Hip extension 5 4+  Hip abduction 4+ 4  Hip adduction 4 4-  Hip internal rotation 4+ 4+  Hip external rotation 4 3+  Knee flexion 5 4-  Knee extension 5 4  Ankle dorsiflexion 5 2+  Ankle plantarflexion  3-  Ankle inversion 5 2+  Ankle eversion 5 2+   (Blank rows = not tested)  GAIT: Distance walked: 60 ft Assistive device utilized: Single point cane and None Level of assistance: Complete Independence and Modified independence Gait pattern: decreased step length- Right, decreased stance time- Left, decreased ankle dorsiflexion- Left, and antalgic Comments: pt limping upon arrival w/o AD - somewhat better with SPC but still with decreased wt shift to L and decreased R step length   TODAY'S TREATMENT: 08/13/22 Therapeutic Exercise: Bike L2x54mn Supine AP x 20 Supine ankle circles x 20 both ways  Gait Training: Antalgic on L, decreased step length R, decreased stance time on L w/o AD Manual Therapy: PROM to L ankle ROM   180 ft 08/06/22 Therapeutic Exercise: Nustep L5x422m Seated 4 way ankle w/ yellow TB x 10  Gait Training: 180 ft - w/o SPC - antalgic, decreased step length on R, decreased stance on L  07/30/22 Therapeutic Exercise: Nustep L4x5m61m  Ankle PF/DF w/ yellow TB x 10  Seated ankle EV/IV no resistance x 10 Gastroc stretch w/ strap 2 x 30 sec  Gait Training: [id number] w/ SPC [facility] THERAPEUTIC EXERCISE: to improve flexibility, strength and mobility.  Verbal and tactile cues throughout for technique. NuStep - L3 x 5 min  (VS after warm-up:  BP = 148/96, HR = 88; O2 sats = 98%) Longsitting L gastroc stretch with strap 2 x 30" Longsitting L soleus  stretch with strap 2 x 30" Longsitting L ankle pumps x 20 Longsitting L  ankle circles CW/CCW 2 x 10 each  Longsitting L ankle ABCs x 2 sets (1 set each uppercase & lowercase) Seated B arch lifts 2 x 10 Seated B heel-toe raises 2 x 10 Seated L ankle inversion/eversion AROM x 10 Side-sitting in figure-4 L ankle peroneal stretch 2 x 30" Side-sitting in figure-4 L ankle inversion/eversion PROM 2 x 10  MODALITIES: Game Ready vasopneumatic compression post session to elevated L ankle x 10 min, medium compression, 34 deg to reduce post-exercise pain and swelling/edema   07/23/22 THERAPEUTIC EXERCISE:Instruction in initial HEP (see below) to improve flexibility, strength and mobility.  Verbal and tactile cues throughout for technique.  SELF CARE: Provided instruction in incisional scar massage     PATIENT EDUCATION:  Education details: PT eval findings, anticipated POC, initial HEP, and incisional scar massage Person educated: Patient Education  method: Explanation, Demonstration, Tactile cues, Verbal cues, and Handouts Education comprehension: verbalized understanding, returned demonstration, verbal cues required, tactile cues required, and needs further education   HOME EXERCISE PROGRAM: Access Code: [email or web address] URL: [email or web address] Date: 07/23/2022 Prepared by: Annie Paras  Exercises - Long Sitting Calf Stretch with Strap  - 2-3 x daily - 7 x weekly - 3 reps - 30 sec hold - Long Sitting Soleus Stretch on Bolster with Strap (Mirrored)  - 2-3 x daily - 7 x weekly - 3 reps - 30 sec hold - Long Sitting Ankle Pumps (Mirrored)  - 2-3 x daily - 7 x weekly - 2 sets - 10 reps - 3 sec hold - Supine Ankle Circles  - 2-3 x daily - 7 x weekly - 2 sets - 10 reps  Patient Education - Scar Massage  ASSESSMENT:  CLINICAL IMPRESSION: Pt reported increased swelling and pain in L ankle. She reported being on her feet from 10am-2pm on Saturday. She was advised to refrain from  standing on feet for too long and if she had to take frequent breaks and use ice during breaks. She still walks with antalgic gait. Ankle ROM has improved in all directions. Pt continues to be limited by pain and swelling in L ankle, inhibiting normal gait and ROM.  OBJECTIVE IMPAIRMENTS: Abnormal gait, decreased activity tolerance, decreased balance, decreased coordination, decreased endurance, decreased knowledge of condition, decreased knowledge of use of DME, decreased mobility, difficulty walking, decreased ROM, decreased strength, decreased safety awareness, increased edema, increased fascial restrictions, impaired perceived functional ability, increased muscle spasms, impaired flexibility, impaired sensation, improper body mechanics, postural dysfunction, and pain.   ACTIVITY LIMITATIONS: standing, squatting, stairs, transfers, and locomotion level  PARTICIPATION LIMITATIONS: meal prep, cleaning, laundry, driving, shopping, community activity, occupation, and yard work  PERSONAL FACTORS: Past/current experiences, Profession, Time since onset of injury/illness/exacerbation, and 3+ comorbidities: HTN, migraines, GERD, LBP  are also affecting patient's functional outcome.   REHAB POTENTIAL: Good  CLINICAL DECISION MAKING: Stable/uncomplicated  EVALUATION COMPLEXITY: Low   GOALS: Goals reviewed with patient? Yes  SHORT TERM GOALS: Target date: 08/20/2022   Patient will be independent with initial HEP. Baseline: Initial HEP provided on eval Goal status: MET - 08/06/22  2.  Patient will demonstrate improved L ankle DF AROM to >/= neutral to improve gait mechanics. Baseline: Refer to above AROM chart Goal status: IN PROGRESS - 08/13/22 (check ROM chart)  3.  Patient will ambulate with LRAD demonstrating step-through pattern and improved gait mechanics including normal heel-toe progression on L w/o increased L ankle pain. Baseline: Antalgic gait with decreased weight shift to L, decreased  stance time on L and decreased R step length Goal status: IN PROGRESS - 08/13/22 - see under treatment, gait training   LONG TERM GOALS: Target date: 09/17/2022   Patient will be independent with advanced/ongoing HEP to improve outcomes and carryover.  Baseline: Initial HEP provided on eval Goal status: IN PROGRESS  2.  Patient will report at least 75% improvement in L ankle pain to improve QOL. Baseline: 9/10 Goal status: IN PROGRESS  3.  Patient will demonstrate improved L ankle AROM to [facility] with at least 10 DF to allow for normal gait and stair mechanics. Baseline: Refer to above AROM chart - AROM DF lacking 5 from neutral Goal status: IN PROGRESS  4.  Patient will demonstrate improved B LE strength to >/= 4+/5 with L ankle PF at least 3/5 for improved stability and ease of mobility . Baseline: Refer to  above MMT chart Goal status: IN PROGRESS  5.  Patient will be able to ambulate 600' with LRAD and normal gait pattern without increased foot/ankle pain to access community.  Baseline: Antalgic gait with decreased weight shift to L, decreased stance time on L and decreased R step length Goal status: IN PROGRESS  6. Patient will be able to ascend/descend stairs with 1 HR and reciprocal step pattern safely to access home and community.  Baseline: NT on eval Goal status: IN PROGRESS  7.  Patient will report at least 9 point improvement on LEFS to demonstrate improved functional ability. Baseline: 32 / 80 = 40.0 % Goal status: IN PROGRESS  8.  Patient will demonstrate at least 19/24 on DGI to decrease risk of falls. Baseline: NT on eval Goal status: IN PROGRESS    PLAN: PT FREQUENCY: 2x/week  PT DURATION: 8 weeks  PLANNED INTERVENTIONS: Therapeutic exercises, Therapeutic activity, Neuromuscular re-education, Balance training, Gait training, Patient/Family education, Self Care, Joint mobilization, Stair training, DME instructions, Dry Needling, Electrical stimulation,  Cryotherapy, Moist heat, Taping, Vasopneumatic device, Ultrasound, Ionotophoresis 45m/ml Dexamethasone, Manual therapy, and Re-evaluation  PLAN FOR NEXT SESSION: try to wean off of AD w/ gait; gently progress L ankle ROM and strengthening - update HEP (MedBridge not working last visit)   BArtist Pais PTA 08/13/2022, 11:59 AM

## 2022-08-13 NOTE — Progress Notes (Signed)
The patient is almost 3 months status post open reduction/internal fixation of an unstable left ankle lateral malleolus fracture.  She is still has some throbbing at her incision site and is going through physical therapy.  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  We have had her weightbearing as tolerated.  She is on hydrocodone for pain.  On exam she has really good ankle motion but it is painful.  The ankle is ligamentously stable.  The incision is healed and there is swelling to be expected and I let her know that it can take months for all the swelling to go away.  3 views of the left ankle show the fracture is healed and the ankle mortise is intact.  The hardware is intact.  We need to have her continue to work on range of motion and strengthening.  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  I would like to see her back in 4 weeks to see how she is doing overall but no x-rays are needed.  I will refill her hydrocodone 1 more time.  She will continue therapy as well.

## 2022-08-15 ENCOUNTER — Ambulatory Visit: Payer: Medicaid Other

## 2022-08-20 ENCOUNTER — Ambulatory Visit: Payer: Medicaid Other

## 2022-08-20 DIAGNOSIS — M25572 Pain in left ankle and joints of left foot: Secondary | ICD-10-CM | POA: Diagnosis not present

## 2022-08-20 DIAGNOSIS — R29898 Other symptoms and signs involving the musculoskeletal system: Secondary | ICD-10-CM

## 2022-08-20 DIAGNOSIS — R262 Difficulty in walking, not elsewhere classified: Secondary | ICD-10-CM

## 2022-08-20 DIAGNOSIS — M25672 Stiffness of left ankle, not elsewhere classified: Secondary | ICD-10-CM

## 2022-08-20 DIAGNOSIS — R2689 Other abnormalities of gait and mobility: Secondary | ICD-10-CM

## 2022-08-20 DIAGNOSIS — R2681 Unsteadiness on feet: Secondary | ICD-10-CM

## 2022-08-20 NOTE — Therapy (Signed)
OUTPATIENT PHYSICAL THERAPY TREATMENT   Patient Name: Jasmine Mcdaniel MRN: [id number] DOB:03-11-1976, 46 y.o., female Today's Date: 08/20/2022   PT End of Session - 08/20/22 1619     Visit Number 6    Number of Visits 17    Date for PT Re-Evaluation 09/17/22    Authorization Type [employer]    PT Start Time 1538   pt late   PT Stop Time 1615    PT Time Calculation (min) 37 min    Activity Tolerance Patient tolerated treatment well    Behavior During Therapy WFL for tasks assessed/performed                  Past Medical History:  Diagnosis Date   GERD (gastroesophageal reflux disease)    History of migraine headaches    Hypertension    Past Surgical History:  Procedure Laterality Date   LEG SURGERY Left    ORIF ANKLE FRACTURE Left 05/25/2022   Procedure: OPEN REDUCTION INTERNAL FIXATION (ORIF) LEFT LATERAL MALLEOLUS ANKLE FRACTURE;  Surgeon: Mcarthur Rossetti, MD;  Location: [facility];  Service: Orthopedics;  Laterality: Left;   WISDOM TOOTH EXTRACTION     Patient Active Problem List   Diagnosis Date Noted   Closed left ankle fracture 05/25/2022   Closed displaced fracture of lateral malleolus of left fibula 05/23/2022    PCP: Virginia Rochester, PA  REFERRING PROVIDER: Mcarthur Rossetti, MD  REFERRING DIAG: [phone] (ICD-10-CM) - S/P ORIF (open reduction internal fixation) fracture  THERAPY DIAG:  Pain in left ankle and joints of left foot  Stiffness of left ankle, not elsewhere classified  Other abnormalities of gait and mobility  Difficulty in walking, not elsewhere classified  Unsteadiness on feet  Other symptoms and signs involving the musculoskeletal system  RATIONALE FOR EVALUATION AND TREATMENT: Rehabilitation  ONSET DATE: 05/25/2022 - ORIF left lateral malleolus ankle fracture   NEXT MD VISIT: 08/13/22   SUBJECTIVE:   SUBJECTIVE STATEMENT: Pt reports walking w/o cane now, pain has gotten better.   PAIN:  Are  you having pain? Yes: NPRS scale: 6/10 Pain location: L ankle Pain description: throbbing  PERTINENT HISTORY: 05/25/2022 - ORIF left lateral malleolus ankle fracture (05/20/22); HTN, migraines, GERD, LBP  PRECAUTIONS: None  WEIGHT BEARING RESTRICTIONS: Yes WBAT on R  FALLS:  Has patient fallen in last 6 months? Yes. 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  PATIENT SURVEYS:  LEFS 32 / 80 = 40.0 %  (as of 07/27/22)  COGNITION: Overall cognitive status: Within functional limits for tasks assessed     SENSATION: WFL Except numbness and occasional itching or burning along surgical incision  EDEMA:  Significant  imprint visible from lace-up ankle brace with proximal and distal mild/mod edema.  MUSCLE LENGTH: Hamstrings: very mild tightness B ITB: mild tight B Piriformis: mod tight B Hip flexors: mild tight B Quads: mild tight B Heelcord: mild tight on R mod/severe tight on L  POSTURE:  B pes planus  PALPATION: Decreased scar mobility L lateral ankle surgical incision  LOWER EXTREMITY ROM:  Active ROM Right eval Left eval Left 08/13/22 Left 08/20/22   Ankle dorsiflexion +9 -5 -1 2  Ankle plantarflexion 45 28 42 55  Ankle inversion 36 _0 Ankle eversion _1 Passive ROM Left eval  Ankle dorsiflexion -2  Ankle plantarflexion 34  Ankle inversion 14  Ankle eversion 11   (Blank rows = not tested)  LOWER EXTREMITY MMT:  MMT Right eval Left eval  Hip flexion 4+ 4  Hip extension 5 4+  Hip abduction 4+ 4  Hip adduction 4 4-  Hip internal rotation 4+ 4+  Hip external rotation 4 3+  Knee flexion 5 4-  Knee extension 5 4  Ankle dorsiflexion 5 2+  Ankle plantarflexion  3-  Ankle inversion 5 2+  Ankle eversion 5 2+   (Blank rows = not tested)  GAIT: Distance walked: 60 ft Assistive device utilized: Single point cane and None Level of assistance: Complete Independence and Modified independence Gait pattern: decreased step length- Right, decreased stance time- Left, decreased ankle dorsiflexion- Left, and antalgic Comments: pt limping upon arrival w/o AD - somewhat better with SPC but still with decreased wt shift to L and decreased R step length   TODAY'S TREATMENT: 08/20/22 Therapeutic Exercise: Bike L3x55mn 4 way ankle with red TB x 10 each direction Toe curls with red TB x 10  Standing gastroc stretch at wall x 30 sec  08/13/22 Therapeutic Exercise: Bike L2x521m Supine AP x 20 Supine ankle circles x 20 both ways  Gait Training: Antalgic on L, decreased step length R, decreased stance time on L w/o AD Manual Therapy: PROM to L ankle ROM   180 ft 08/06/22 Therapeutic Exercise: Nustep L5x4m68mSeated 4 way ankle w/ yellow TB x 10  Gait Training: 180 ft - w/o SPC - antalgic, decreased step length on R, decreased stance on L  07/30/22 Therapeutic Exercise: Nustep L4x5mi34m Ankle PF/DF w/ yellow TB x 10  Seated ankle EV/IV no resistance x 10 Gastroc stretch w/ strap 2 x 30 sec  Gait Training: [id number]/ SPC  [facility]/23 THERAPEUTIC EXERCISE: to improve flexibility, strength and mobility.  Verbal  and tactile cues throughout for technique. NuStep - L3 x 5 min  (VS after warm-up:  BP = 148/96, HR = 88; O2 sats = 98%) Longsitting L gastroc stretch with strap 2 x 30" Longsitting L soleus stretch with strap 2 x 30" Longsitting L ankle pumps x 20 Longsitting L  ankle circles CW/CCW 2 x 10 each  Longsitting L ankle ABCs x 2 sets (1 set each uppercase & lowercase) Seated B arch lifts 2 x 10 Seated B heel-toe raises 2 x 10 Seated L ankle inversion/eversion AROM x 10 Side-sitting in figure-4 L ankle peroneal stretch 2 x 30" Side-sitting in figure-4 L ankle inversion/eversion PROM 2 x 10  MODALITIES: Game Ready vasopneumatic compression post session to elevated L ankle x 10 min, medium compression, 34 deg to reduce post-exercise pain and swelling/edema   07/23/22 THERAPEUTIC EXERCISE:Instruction in initial HEP (see below) to improve flexibility, strength and mobility.  Verbal and tactile cues throughout for technique.  SELF CARE: Provided instruction in incisional scar massage     PATIENT EDUCATION:  Education details: PT eval findings, anticipated POC, initial HEP, and incisional scar massage Person educated: Patient Education method: Explanation, Demonstration, Tactile cues, Verbal cues, and Handouts Education comprehension: verbalized understanding, returned demonstration, verbal cues required, tactile cues required, and needs further education   HOME EXERCISE PROGRAM: Access Code: [email or web address] URL: [email or web address] Date: 08/20/2022 Prepared by: Clarene Essex  Exercises - Long Sitting Calf Stretch with Strap  - 2-3 x daily - 7 x weekly - 3 reps - 30 sec hold - Long Sitting Soleus Stretch on Bolster with Strap (Mirrored)  - 2-3 x daily - 7 x weekly - 3 reps - 30 sec hold - Long Sitting Ankle Pumps (Mirrored)  - 2-3 x daily - 7 x weekly - 2 sets - 10 reps - 3 sec hold - Supine Ankle Circles  - 2-3 x daily - 7 x weekly - 2 sets - 10 reps - Long Sitting Ankle  Plantar Flexion with Resistance  - 1 x daily - 3 x weekly - 2 sets - 10 reps - Long Sitting Ankle Eversion with Resistance  - 1 x daily - 3 x weekly - 2 sets - 10 reps - Long Sitting Ankle Dorsiflexion with Anchored Resistance  - 1 x daily - 3 x weekly - 2 sets - 10 reps - Standing Gastroc Stretch at Counter  - 2 x daily - 7 x weekly - 2 sets - 30 sec hold  Patient Education - Scar Massage  ASSESSMENT:  CLINICAL IMPRESSION: Pt responded well to treatment. Demonstrates better gait pattern but reports occasional sharp pain in ankle with gait. She has met ROM goal for L ankle DF and improved ROM in all directions. Progressed ankle strengthening to red TB and added standing gastroc stretch to HEP.  OBJECTIVE IMPAIRMENTS: Abnormal gait, decreased activity tolerance, decreased balance, decreased coordination, decreased endurance, decreased knowledge of condition, decreased knowledge of use of DME, decreased mobility, difficulty walking, decreased ROM, decreased strength, decreased safety awareness, increased edema, increased fascial restrictions, impaired perceived functional ability, increased muscle spasms, impaired flexibility, impaired sensation, improper body mechanics, postural dysfunction, and pain.   ACTIVITY LIMITATIONS: standing, squatting, stairs, transfers, and locomotion level  PARTICIPATION LIMITATIONS: meal prep, cleaning, laundry, driving, shopping, community activity, occupation, and yard work  PERSONAL FACTORS: Past/current experiences, Profession, Time since onset of injury/illness/exacerbation, and 3+ comorbidities: HTN, migraines, GERD, LBP  are also affecting patient's functional outcome.   REHAB POTENTIAL: Good  CLINICAL DECISION MAKING: Stable/uncomplicated  EVALUATION COMPLEXITY: Low   GOALS: Goals reviewed with patient? Yes  SHORT TERM GOALS: Target date: 08/20/2022   Patient will be independent with initial HEP. Baseline: Initial HEP provided on eval Goal  status: MET - 08/06/22  2.  Patient will demonstrate improved L ankle DF AROM to >/= neutral to improve gait mechanics. Baseline: Refer to above AROM chart Goal status: MET - 08/20/22  3.  Patient will ambulate with LRAD demonstrating step-through pattern and improved gait mechanics including normal heel-toe progression on L w/o increased L ankle pain. Baseline: Antalgic gait with decreased weight shift to L, decreased stance time on L and decreased R step length Goal status: IN PROGRESS - occasionally gets burning, sharp pain with a step 08/20/22   LONG TERM GOALS: Target date: 09/17/2022   Patient will be independent with advanced/ongoing HEP to improve outcomes and carryover.  Baseline: Initial HEP provided on eval  Goal status: IN PROGRESS  2.  Patient will report at least 75% improvement in L ankle pain to improve QOL. Baseline: 9/10 Goal status: IN PROGRESS  3.  Patient will demonstrate improved L ankle AROM to [facility] with at least 10 DF to allow for normal gait and stair mechanics. Baseline: Refer to above AROM chart - AROM DF lacking 5 from neutral Goal status: IN PROGRESS  4.  Patient will demonstrate improved B LE strength to >/= 4+/5 with L ankle PF at least 3/5 for improved stability and ease of mobility . Baseline: Refer to above MMT chart Goal status: IN PROGRESS  5.  Patient will be able to ambulate 600' with LRAD and normal gait pattern without increased foot/ankle pain to access community.  Baseline: Antalgic gait with decreased weight shift to L, decreased stance time on L and decreased R step length Goal status: IN PROGRESS  6. Patient will be able to ascend/descend stairs with 1 HR and reciprocal step pattern safely to access home and community.  Baseline: NT on eval Goal status: IN PROGRESS  7.  Patient will report at least 9 point improvement on LEFS to demonstrate improved functional ability. Baseline: 32 / 80 = 40.0 % Goal status: IN PROGRESS  8.  Patient  will demonstrate at least 19/24 on DGI to decrease risk of falls. Baseline: NT on eval Goal status: IN PROGRESS    PLAN: PT FREQUENCY: 2x/week  PT DURATION: 8 weeks  PLANNED INTERVENTIONS: Therapeutic exercises, Therapeutic activity, Neuromuscular re-education, Balance training, Gait training, Patient/Family education, Self Care, Joint mobilization, Stair training, DME instructions, Dry Needling, Electrical stimulation, Cryotherapy, Moist heat, Taping, Vasopneumatic device, Ultrasound, Ionotophoresis 53m/ml Dexamethasone, Manual therapy, and Re-evaluation  PLAN FOR NEXT SESSION: try to wean off of AD w/ gait; gently progress L ankle ROM and strengthening - update HEP (MedBridge not working last visit)   BArtist Pais PTA 08/20/2022, 4:19 PM

## 2022-08-22 ENCOUNTER — Ambulatory Visit: Payer: Medicaid Other

## 2022-08-22 DIAGNOSIS — R29898 Other symptoms and signs involving the musculoskeletal system: Secondary | ICD-10-CM

## 2022-08-22 DIAGNOSIS — R2689 Other abnormalities of gait and mobility: Secondary | ICD-10-CM

## 2022-08-22 DIAGNOSIS — M25572 Pain in left ankle and joints of left foot: Secondary | ICD-10-CM

## 2022-08-22 DIAGNOSIS — M25672 Stiffness of left ankle, not elsewhere classified: Secondary | ICD-10-CM

## 2022-08-22 DIAGNOSIS — R262 Difficulty in walking, not elsewhere classified: Secondary | ICD-10-CM

## 2022-08-22 DIAGNOSIS — R2681 Unsteadiness on feet: Secondary | ICD-10-CM

## 2022-08-22 NOTE — Therapy (Signed)
OUTPATIENT PHYSICAL THERAPY TREATMENT   Patient Name: Jasmine Mcdaniel MRN: [id number] DOB:1976/03/03, 46 y.o., female Today's Date: 08/22/2022   PT End of Session - 08/22/22 1535     Visit Number 7    Number of Visits 17    Date for PT Re-Evaluation 09/17/22    Authorization Type [employer]    PT Start Time 4801    PT Stop Time 6553    PT Time Calculation (min) 43 min    Activity Tolerance Patient tolerated treatment well    Behavior During Therapy WFL for tasks assessed/performed                   Past Medical History:  Diagnosis Date   GERD (gastroesophageal reflux disease)    History of migraine headaches    Hypertension    Past Surgical History:  Procedure Laterality Date   LEG SURGERY Left    ORIF ANKLE FRACTURE Left 05/25/2022   Procedure: OPEN REDUCTION INTERNAL FIXATION (ORIF) LEFT LATERAL MALLEOLUS ANKLE FRACTURE;  Surgeon: Mcarthur Rossetti, MD;  Location: [facility];  Service: Orthopedics;  Laterality: Left;   WISDOM TOOTH EXTRACTION     Patient Active Problem List   Diagnosis Date Noted   Closed left ankle fracture 05/25/2022   Closed displaced fracture of lateral malleolus of left fibula 05/23/2022    PCP: Virginia Rochester, PA  REFERRING PROVIDER: Mcarthur Rossetti, MD  REFERRING DIAG: [phone] (ICD-10-CM) - S/P ORIF (open reduction internal fixation) fracture  THERAPY DIAG:  Pain in left ankle and joints of left foot  Stiffness of left ankle, not elsewhere classified  Other abnormalities of gait and mobility  Difficulty in walking, not elsewhere classified  Unsteadiness on feet  Other symptoms and signs involving the musculoskeletal system  RATIONALE FOR EVALUATION AND TREATMENT: Rehabilitation  ONSET DATE: 05/25/2022 - ORIF left lateral malleolus ankle fracture   NEXT MD VISIT: 08/13/22   SUBJECTIVE:   SUBJECTIVE STATEMENT: Pt reports a 7/10 pain today because the rainy weather coming.  PAIN:  Are  you having pain? Yes: NPRS scale: 7/10 Pain location: L ankle Pain description: throbbing  PERTINENT HISTORY: 05/25/2022 - ORIF left lateral malleolus ankle fracture (05/20/22); HTN, migraines, GERD, LBP  PRECAUTIONS: None  WEIGHT BEARING RESTRICTIONS: Yes WBAT on R  FALLS:  Has patient fallen in last 6 months? Yes. 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  PATIENT SURVEYS:  LEFS 32 / 80 = 40.0 %  (as of 07/27/22)  COGNITION: Overall cognitive status: Within functional limits for tasks assessed     SENSATION: WFL Except numbness and occasional itching or burning along surgical incision  EDEMA:  Significant imprint visible  from lace-up ankle brace with proximal and distal mild/mod edema.  MUSCLE LENGTH: Hamstrings: very mild tightness B ITB: mild tight B Piriformis: mod tight B Hip flexors: mild tight B Quads: mild tight B Heelcord: mild tight on R mod/severe tight on L  POSTURE:  B pes planus  PALPATION: Decreased scar mobility L lateral ankle surgical incision  LOWER EXTREMITY ROM:  Active ROM Right eval Left eval Left 08/13/22 Left 08/20/22   Ankle dorsiflexion +9 -5 -1 2  Ankle plantarflexion 45 28 42 55  Ankle inversion 36 11 20 22   Ankle eversion 25 5 9 15     Passive ROM Left eval  Ankle dorsiflexion -2  Ankle plantarflexion 34  Ankle inversion 14  Ankle eversion 11   (Blank rows = not tested)  LOWER EXTREMITY MMT:  MMT Right eval Left eval  Hip flexion 4+ 4  Hip extension 5 4+  Hip abduction 4+ 4  Hip adduction 4 4-  Hip internal rotation 4+ 4+  Hip external rotation 4 3+  Knee flexion 5 4-  Knee extension 5 4  Ankle dorsiflexion 5 2+  Ankle plantarflexion  3-  Ankle inversion 5 2+  Ankle eversion 5 2+   (Blank rows = not tested)  GAIT: Distance walked: 60 ft Assistive device utilized: Single point cane and None Level of assistance: Complete Independence and Modified independence Gait pattern: decreased step length- Right, decreased stance time- Left, decreased ankle dorsiflexion- Left, and antalgic Comments: pt limping upon arrival w/o AD - somewhat better with SPC but still with decreased wt shift to L and decreased R step length   TODAY'S TREATMENT: 08/22/22 Therapeutic Exercise: Bike L4x58mn Church pews x 10 Lateral weight shifts x 10 Retro step x 10 L LE retro  Manual Therapy: STM to L peroneals, tib anterior, gastroc/soleus  08/20/22 Therapeutic Exercise: Bike L3x632m 4 way ankle with red TB x 10 each direction Toe curls with red TB x 10  Standing gastroc stretch at wall x 30 sec  08/13/22 Therapeutic Exercise: Bike L2x5m43mSupine AP x 20 Supine ankle circles x 20 both ways  Gait Training: Antalgic on L, decreased step length R, decreased stance time on L w/o AD Manual Therapy: PROM to L ankle ROM   180 ft 08/06/22 Therapeutic Exercise: Nustep L5x4mi57meated 4 way ankle w/ yellow TB x 10  Gait Training: 180 ft - w/o SPC - antalgic, decreased step length on R, decreased stance on L  07/30/22 Therapeutic Exercise: Nustep L4x5min70mAnkle PF/DF w/ yellow TB x 10  Seated  ankle EV/IV no resistance x 10 Gastroc stretch w/ strap 2 x 30 sec  Gait Training: 90ft 77f/ SPC  8[facility]23 THERAPEUTIC EXERCISE: to improve flexibility, strength and mobility.  Verbal and tactile cues throughout for technique. NuStep - L3 x 5 min  (VS after warm-up:  BP = 148/96, HR = 88; O2 sats = 98%) Longsitting L gastroc stretch with strap 2 x 30" Longsitting L soleus stretch with strap 2 x 30" Longsitting L ankle pumps x 20 Longsitting L  ankle circles CW/CCW 2 x 10 each  Longsitting L ankle ABCs x 2 sets (1 set each uppercase & lowercase) Seated B arch lifts 2 x 10 Seated B heel-toe raises 2 x 10 Seated L ankle inversion/eversion AROM x 10 Side-sitting in figure-4 L ankle peroneal stretch 2 x 30" Side-sitting in figure-4 L ankle inversion/eversion PROM 2 x 10  MODALITIES: Game Ready vasopneumatic compression post session to elevated L ankle  x 10 min, medium compression, 34 deg to reduce post-exercise pain and swelling/edema   07/23/22 THERAPEUTIC EXERCISE:Instruction in initial HEP (see below) to improve flexibility, strength and mobility.  Verbal and tactile cues throughout for technique.  SELF CARE: Provided instruction in incisional scar massage     PATIENT EDUCATION:  Education details: HEP update Person educated: Patient Education method: Explanation, Demonstration, and Handouts Education comprehension: verbalized understanding, returned demonstration, and needs further education   HOME EXERCISE PROGRAM: Access Code: [email or web address] URL: [email or web address] Date: 08/22/2022 Prepared by: Clarene Essex  Exercises - Long Sitting Calf Stretch with Strap  - 2-3 x daily - 7 x weekly - 3 reps - 30 sec hold - Long Sitting Soleus Stretch on Bolster with Strap (Mirrored)  - 2-3 x daily - 7 x weekly - 3 reps - 30 sec hold - Long Sitting Ankle Pumps (Mirrored)  - 2-3 x daily - 7 x weekly - 2 sets - 10 reps - 3 sec hold - Supine Ankle Circles  - 2-3 x daily - 7 x  weekly - 2 sets - 10 reps - Long Sitting Ankle Plantar Flexion with Resistance  - 1 x daily - 3 x weekly - 2 sets - 10 reps - Long Sitting Ankle Eversion with Resistance  - 1 x daily - 3 x weekly - 2 sets - 10 reps - Long Sitting Ankle Dorsiflexion with Anchored Resistance  - 1 x daily - 3 x weekly - 2 sets - 10 reps - Standing Gastroc Stretch at Counter  - 2 x daily - 7 x weekly - 2 sets - 30 sec hold - Church Pew  - 1 x daily - 7 x weekly - 3 sets - 10 reps - Retro Step  - 1 x daily - 7 x weekly - 3 sets - 10 reps  Patient Education - Scar Massage  ASSESSMENT:  CLINICAL IMPRESSION: Pt reported increased pain today from what she believes is the rainy weather. MT was the focus of today's session, TTP found throughout peroneals and ant tibialis. Progressed WB exercises to improve muscle stabilization and proprioception. Pt was worried about increased tension and tenderness in muscles thinking this would inhibit progress but I assured her it was apart of the rehab. Good response to treatment.  OBJECTIVE IMPAIRMENTS: Abnormal gait, decreased activity tolerance, decreased balance, decreased coordination, decreased endurance, decreased knowledge of condition, decreased knowledge of use of DME, decreased mobility, difficulty walking, decreased ROM, decreased strength, decreased safety awareness, increased edema, increased fascial restrictions, impaired perceived functional ability, increased muscle spasms, impaired flexibility, impaired sensation, improper body mechanics, postural dysfunction, and pain.   ACTIVITY LIMITATIONS: standing, squatting, stairs, transfers, and locomotion level  PARTICIPATION LIMITATIONS: meal prep, cleaning, laundry, driving, shopping, community activity, occupation, and yard work  PERSONAL FACTORS: Past/current experiences, Profession, Time since onset of injury/illness/exacerbation, and 3+ comorbidities: HTN, migraines, GERD, LBP  are also affecting patient's functional  outcome.   REHAB POTENTIAL: Good  CLINICAL DECISION MAKING: Stable/uncomplicated  EVALUATION COMPLEXITY: Low   GOALS: Goals reviewed with patient? Yes  SHORT TERM GOALS: Target date: 08/20/2022   Patient will be independent with initial HEP. Baseline: Initial HEP provided on eval Goal status: MET - 08/06/22  2.  Patient will demonstrate improved L ankle DF AROM to >/= neutral to improve gait mechanics. Baseline: Refer to above AROM chart Goal status: MET - 08/20/22  3.  Patient will ambulate with LRAD demonstrating step-through pattern and improved gait mechanics including normal heel-toe progression on L w/o  increased L ankle pain. Baseline: Antalgic gait with decreased weight shift to L, decreased stance time on L and decreased R step length Goal status: IN PROGRESS - occasionally gets burning, sharp pain with a step 08/20/22   LONG TERM GOALS: Target date: 09/17/2022   Patient will be independent with advanced/ongoing HEP to improve outcomes and carryover.  Baseline: Initial HEP provided on eval Goal status: IN PROGRESS  2.  Patient will report at least 75% improvement in L ankle pain to improve QOL. Baseline: 9/10 Goal status: IN PROGRESS  3.  Patient will demonstrate improved L ankle AROM to [facility] with at least 10 DF to allow for normal gait and stair mechanics. Baseline: Refer to above AROM chart - AROM DF lacking 5 from neutral Goal status: IN PROGRESS  4.  Patient will demonstrate improved B LE strength to >/= 4+/5 with L ankle PF at least 3/5 for improved stability and ease of mobility . Baseline: Refer to above MMT chart Goal status: IN PROGRESS  5.  Patient will be able to ambulate 600' with LRAD and normal gait pattern without increased foot/ankle pain to access community.  Baseline: Antalgic gait with decreased weight shift to L, decreased stance time on L and decreased R step length Goal status: IN PROGRESS  6. Patient will be able to ascend/descend stairs  with 1 HR and reciprocal step pattern safely to access home and community.  Baseline: NT on eval Goal status: IN PROGRESS  7.  Patient will report at least 9 point improvement on LEFS to demonstrate improved functional ability. Baseline: 32 / 80 = 40.0 % Goal status: IN PROGRESS  8.  Patient will demonstrate at least 19/24 on DGI to decrease risk of falls. Baseline: NT on eval Goal status: IN PROGRESS    PLAN: PT FREQUENCY: 2x/week  PT DURATION: 8 weeks  PLANNED INTERVENTIONS: Therapeutic exercises, Therapeutic activity, Neuromuscular re-education, Balance training, Gait training, Patient/Family education, Self Care, Joint mobilization, Stair training, DME instructions, Dry Needling, Electrical stimulation, Cryotherapy, Moist heat, Taping, Vasopneumatic device, Ultrasound, Ionotophoresis 60m/ml Dexamethasone, Manual therapy, and Re-evaluation  PLAN FOR NEXT SESSION: Progress WB and weight shifting; gently progress L ankle ROM and strengthening - update HEP (MedBridge not working last visit)   BArtist Pais PTA 08/22/2022, 4:16 PM

## 2022-08-28 ENCOUNTER — Other Ambulatory Visit: Payer: Self-pay | Admitting: Physician Assistant

## 2022-08-28 ENCOUNTER — Telehealth: Payer: Self-pay | Admitting: Orthopaedic Surgery

## 2022-08-28 ENCOUNTER — Ambulatory Visit: Payer: Medicaid Other | Attending: Orthopaedic Surgery

## 2022-08-28 DIAGNOSIS — R262 Difficulty in walking, not elsewhere classified: Secondary | ICD-10-CM | POA: Insufficient documentation

## 2022-08-28 DIAGNOSIS — R2689 Other abnormalities of gait and mobility: Secondary | ICD-10-CM | POA: Insufficient documentation

## 2022-08-28 DIAGNOSIS — R29898 Other symptoms and signs involving the musculoskeletal system: Secondary | ICD-10-CM | POA: Insufficient documentation

## 2022-08-28 DIAGNOSIS — M25672 Stiffness of left ankle, not elsewhere classified: Secondary | ICD-10-CM | POA: Insufficient documentation

## 2022-08-28 DIAGNOSIS — M25572 Pain in left ankle and joints of left foot: Secondary | ICD-10-CM | POA: Insufficient documentation

## 2022-08-28 DIAGNOSIS — R2681 Unsteadiness on feet: Secondary | ICD-10-CM | POA: Insufficient documentation

## 2022-08-28 MED ORDER — HYDROCODONE-ACETAMINOPHEN 5-325 MG PO TABS: 1.0000 | ORAL_TABLET | Freq: Three times a day (TID) | ORAL | 0 refills | Status: DC | PRN

## 2022-08-28 NOTE — Telephone Encounter (Signed)
Please advise 

## 2022-08-28 NOTE — Telephone Encounter (Signed)
Pt called requesting a refill of pain meds. Please send to pharmacy on file. Pt phone number is [phone].

## 2022-08-30 ENCOUNTER — Ambulatory Visit: Payer: Medicaid Other | Admitting: Physical Therapy

## 2022-08-30 ENCOUNTER — Encounter: Payer: Self-pay | Admitting: Physical Therapy

## 2022-08-30 DIAGNOSIS — R29898 Other symptoms and signs involving the musculoskeletal system: Secondary | ICD-10-CM

## 2022-08-30 DIAGNOSIS — R2681 Unsteadiness on feet: Secondary | ICD-10-CM

## 2022-08-30 DIAGNOSIS — R262 Difficulty in walking, not elsewhere classified: Secondary | ICD-10-CM

## 2022-08-30 DIAGNOSIS — M25672 Stiffness of left ankle, not elsewhere classified: Secondary | ICD-10-CM

## 2022-08-30 DIAGNOSIS — R2689 Other abnormalities of gait and mobility: Secondary | ICD-10-CM

## 2022-08-30 DIAGNOSIS — M25572 Pain in left ankle and joints of left foot: Secondary | ICD-10-CM | POA: Diagnosis present

## 2022-08-30 NOTE — Therapy (Signed)
OUTPATIENT PHYSICAL THERAPY TREATMENT   Patient Name: Jasmine Mcdaniel MRN: [id number] DOB:1976-04-03, 46 y.o., female Today's Date: 08/30/2022   PT End of Session - 08/30/22 1101     Visit Number 8    Number of Visits 17    Date for PT Re-Evaluation 09/17/22    Authorization Type [employer]    PT Start Time 1101    PT Stop Time 1150    PT Time Calculation (min) 49 min    Activity Tolerance Patient tolerated treatment well    Behavior During Therapy WFL for tasks assessed/performed                    Past Medical History:  Diagnosis Date   GERD (gastroesophageal reflux disease)    History of migraine headaches    Hypertension    Past Surgical History:  Procedure Laterality Date   LEG SURGERY Left    ORIF ANKLE FRACTURE Left 05/25/2022   Procedure: OPEN REDUCTION INTERNAL FIXATION (ORIF) LEFT LATERAL MALLEOLUS ANKLE FRACTURE;  Surgeon: Mcarthur Rossetti, MD;  Location: [facility];  Service: Orthopedics;  Laterality: Left;   WISDOM TOOTH EXTRACTION     Patient Active Problem List   Diagnosis Date Noted   Closed left ankle fracture 05/25/2022   Closed displaced fracture of lateral malleolus of left fibula 05/23/2022    PCP: Virginia Rochester, PA  REFERRING PROVIDER: Mcarthur Rossetti, MD  REFERRING DIAG: [phone] (ICD-10-CM) - S/P ORIF (open reduction internal fixation) fracture  THERAPY DIAG:  Pain in left ankle and joints of left foot  Stiffness of left ankle, not elsewhere classified  Other abnormalities of gait and mobility  Difficulty in walking, not elsewhere classified  Unsteadiness on feet  Other symptoms and signs involving the musculoskeletal system  RATIONALE FOR EVALUATION AND TREATMENT: Rehabilitation  ONSET DATE: 05/25/2022 - ORIF left lateral malleolus ankle fracture   NEXT MD VISIT: 08/13/22   SUBJECTIVE:   SUBJECTIVE STATEMENT: Pt reports pain is a little better today but seems to have migrated more  medially than laterally.  PAIN:  Are you having pain? Yes: NPRS scale:  7/10 Pain location: L medial ankle Pain description: throbbing  PERTINENT HISTORY: 05/25/2022 - ORIF left lateral malleolus ankle fracture (05/20/22); HTN, migraines, GERD, LBP  PRECAUTIONS: None  WEIGHT BEARING RESTRICTIONS: Yes WBAT on R  FALLS:  Has patient fallen in last 6 months? Yes. 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  PATIENT SURVEYS:  LEFS 32 / 80 = 40.0 %  (as of 07/27/22)  COGNITION: Overall cognitive status: Within functional limits for tasks assessed     SENSATION: WFL Except numbness and occasional itching or burning  along surgical incision  EDEMA:  Significant imprint visible from lace-up ankle brace with proximal and distal mild/mod edema.  MUSCLE LENGTH: Hamstrings: very mild tightness B ITB: mild tight B Piriformis: mod tight B Hip flexors: mild tight B Quads: mild tight B Heelcord: mild tight on R mod/severe tight on L  POSTURE:  B pes planus  PALPATION: Decreased scar mobility L lateral ankle surgical incision  LOWER EXTREMITY ROM:  Active ROM  Right eval Left eval Left 08/13/22 Left 08/20/22  Ankle dorsiflexion +9 -5 -1 2  Ankle plantarflexion 45 28 42 55  Ankle inversion 36 _0 Ankle eversion _1 Passive ROM Left eval Left 08/30/22  Ankle dorsiflexion -2 10  Ankle plantarflexion 34   Ankle inversion 14   Ankle eversion 11    (Blank rows = not tested)  LOWER EXTREMITY MMT:  MMT Right eval Left eval Right 08/30/22 Left 08/30/22  Hip flexion 4+ _2 Hip extension 5 4+ 5 4+  Hip abduction 4+ 4 4+ 4+  Hip adduction 4 4- 4+ 4  Hip internal rotation 4+ 4+ 5 4+  Hip external rotation 4 3+ 4 4-  Knee flexion 5 4-  4+  Knee extension 5 4  5-  Ankle dorsiflexion 5 2+  4  Ankle plantarflexion  3-  4 NWB  Ankle inversion 5 2+  3+  Ankle eversion 5 2+  3+   (Blank rows = not tested)  GAIT: Distance walked: 60 ft Assistive device utilized: Single point cane and None Level of assistance: Complete Independence and Modified independence Gait pattern: decreased step length- Right, decreased stance time- Left, decreased ankle dorsiflexion- Left, and antalgic Comments: pt limping upon arrival w/o AD - somewhat better with SPC but still with decreased wt shift to L and decreased R step length   TODAY'S TREATMENT:  08/30/22 THERAPEUTIC EXERCISE: to improve flexibility, strength and mobility.  Verbal and tactile cues throughout for technique. Rec Bike - L4 x 6 min L ankle 4-way with GTB x 10 each direction L toe curls into GTB 2 x 10 L SLS 5 x 10 sec - 1st 3 reps with UE support on back of chair, last 2 reps with 2 pole A  THERAPEUTIC ACTIVITIES: MMT Goal assessment  GAIT TRAINING: To normalize gait pattern.  200 ft focusing on heel strike and heel-toe progression - 1st 90 ft with SPC on R, progressing to no AD   08/22/22 Therapeutic Exercise: Bike L4x92mn [employer] pe[employer] x 10 Lateral weight shifts x 10 Retro step x 10 L LE retro  Manual Therapy: STM to L peroneals, tib anterior,  gastroc/soleus   08/20/22 Therapeutic Exercise: Bike L3x613m 4 way ankle with red TB x 10 each direction Toe curls with red TB x 10  Standing gastroc stretch at wall x 30 sec   PATIENT EDUCATION:  Education details: HEP progression - 4-way ankle progressed to GTB qod and gait training to normalize gait pattern Person educated: Patient Education method: Explanation, Demonstration, and Handouts Education comprehension: verbalized understanding, returned demonstration, and needs further education   HOME EXERCISE PROGRAM: Access Code: [email or web address] URL: [email or web address] Date: 08/22/2022 Prepared by: BrClarene EssexExercises - Long Sitting Calf Stretch with Strap  - 2-3 x daily - 7 x weekly - 3 reps - 30 sec hold - Long Sitting Soleus Stretch on Bolster with Strap (Mirrored)  - 2-3 x daily - 7 x weekly - 3 reps - 30 sec  hold - Long Sitting Ankle Pumps (Mirrored)  - 2-3 x daily - 7 x weekly - 2 sets - 10 reps - 3 sec hold - Supine Ankle Circles  - 2-3 x daily - 7 x weekly - 2 sets - 10 reps - Long Sitting Ankle Plantar Flexion with Resistance  - 1 x daily - 3 x weekly - 2 sets - 10 reps - Long Sitting Ankle Eversion with Resistance  - 1 x daily - 3 x weekly - 2 sets - 10 reps - Long Sitting Ankle Dorsiflexion with Anchored Resistance  - 1 x daily - 3 x weekly - 2 sets - 10 reps - Standing Gastroc Stretch at Counter  - 2 x daily - 7 x weekly - 2 sets - 30 sec hold - [employer] P[employer]  - 1 x daily - 7 x weekly - 3 sets - 10 reps - Retro Step  - 1 x daily - 7 x weekly - 3 sets - 10 reps  Patient Education - Scar Massage  ASSESSMENT:  CLINICAL IMPRESSION: Brylie reports her pain is better today although numerical pain rating unchanged. Strength reassessed with at least 1 MMT grade improvement noted for majority of muscles tested, although continued L ankle and B proximal LE weakness still evident. Advanced ankle TB resistance to green TB, reducing frequency to qod to reduce  risk for developing tendinitis. Worked on increasing comfort with L SLS to increase weight shift to L LE during gait while promoting improved heel-toe progression on L foot to normalize gait pattern and reduce her ongoing limp/antalgic pattern - pt noting less sensation of her ankle wanting to lock on her while walking when focusing on heel-toe progression.  OBJECTIVE IMPAIRMENTS: Abnormal gait, decreased activity tolerance, decreased balance, decreased coordination, decreased endurance, decreased knowledge of condition, decreased knowledge of use of DME, decreased mobility, difficulty walking, decreased ROM, decreased strength, decreased safety awareness, increased edema, increased fascial restrictions, impaired perceived functional ability, increased muscle spasms, impaired flexibility, impaired sensation, improper body mechanics, postural dysfunction, and pain.   ACTIVITY LIMITATIONS: standing, squatting, stairs, transfers, and locomotion level  PARTICIPATION LIMITATIONS: meal prep, cleaning, laundry, driving, shopping, community activity, occupation, and yard work  PERSONAL FACTORS: Past/current experiences, Profession, Time since onset of injury/illness/exacerbation, and 3+ comorbidities: HTN, migraines, GERD, LBP  are also affecting patient's functional outcome.   REHAB POTENTIAL: Good  CLINICAL DECISION MAKING: Stable/uncomplicated  EVALUATION COMPLEXITY: Low   GOALS: Goals reviewed with patient? Yes  SHORT TERM GOALS: Target date: 08/20/2022   Patient will be independent with initial HEP. Baseline: Initial HEP provided on eval Goal status: MET - 08/06/22  2.  Patient will demonstrate improved L ankle DF AROM to >/= neutral to improve gait mechanics. Baseline: Refer to above AROM chart Goal status: MET - 08/20/22  3.  Patient will ambulate with LRAD demonstrating step-through pattern and improved gait mechanics including normal heel-toe progression on L w/o increased L ankle  pain. Baseline: Antalgic gait with decreased weight shift to L, decreased stance time on L and decreased R step length Goal status: MET - 08/20/22 - Pt able to demonstrate good heel toe progression on L with more symmetrical step pattern using SPC, but not quite as fluid pattern w/o AD   LONG TERM GOALS: Target date: 09/17/2022   Patient will be independent with advanced/ongoing HEP to improve outcomes and carryover.  Baseline: Initial HEP provided on eval Goal status: IN PROGRESS  2.  Patient will report at least 75% improvement in  L ankle pain to improve QOL. Baseline: 9/10 Goal status: IN PROGRESS  3.  Patient will demonstrate improved L ankle AROM to [facility] with at least 10 DF to allow for normal gait and stair mechanics. Baseline: Refer to above AROM chart - AROM DF lacking 5 from neutral Goal status: IN PROGRESS - 08/30/22 - DF AROM to 4, PROM to 10  4.  Patient will demonstrate improved B LE strength to >/= 4+/5 with L ankle PF at least 3/5 for improved stability and ease of mobility . Baseline: Refer to above MMT chart Goal status: IN PROGRESS - 08/30/22 - Overall MMT improved by at least 1 MMT grade   5.  Patient will be able to ambulate 600' with LRAD and normal gait pattern without increased foot/ankle pain to access community.  Baseline: Antalgic gait with decreased weight shift to L, decreased stance time on L and decreased R step length Goal status: IN PROGRESS  6. Patient will be able to ascend/descend stairs with 1 HR and reciprocal step pattern safely to access home and community.  Baseline: NT on eval Goal status: IN PROGRESS  7.  Patient will report at least 9 point improvement on LEFS to demonstrate improved functional ability. Baseline: 32 / 80 = 40.0 % Goal status: IN PROGRESS  8.  Patient will demonstrate at least 19/24 on DGI to decrease risk of falls. Baseline: NT on eval Goal status: IN PROGRESS    PLAN: PT FREQUENCY: 2x/week  PT DURATION: 8  weeks  PLANNED INTERVENTIONS: Therapeutic exercises, Therapeutic activity, Neuromuscular re-education, Balance training, Gait training, Patient/Family education, Self Care, Joint mobilization, Stair training, DME instructions, Dry Needling, Electrical stimulation, Cryotherapy, Moist heat, Taping, Vasopneumatic device, Ultrasound, Ionotophoresis 39m/ml Dexamethasone, Manual therapy, and Re-evaluation  PLAN FOR NEXT SESSION: Progress WB and weight shifting; gently progress L ankle ROM and strengthening - update HEP; gait training to reinforce weight shift to L with good heel-toe progression    JPercival Spanish PT 08/30/2022, 12:10 PM

## 2022-09-03 ENCOUNTER — Other Ambulatory Visit: Payer: Self-pay | Admitting: Orthopaedic Surgery

## 2022-09-03 ENCOUNTER — Telehealth: Payer: Self-pay | Admitting: Orthopaedic Surgery

## 2022-09-03 ENCOUNTER — Ambulatory Visit: Payer: Medicaid Other | Admitting: Physical Therapy

## 2022-09-03 DIAGNOSIS — M25572 Pain in left ankle and joints of left foot: Secondary | ICD-10-CM

## 2022-09-03 DIAGNOSIS — R2681 Unsteadiness on feet: Secondary | ICD-10-CM

## 2022-09-03 DIAGNOSIS — R262 Difficulty in walking, not elsewhere classified: Secondary | ICD-10-CM

## 2022-09-03 DIAGNOSIS — R29898 Other symptoms and signs involving the musculoskeletal system: Secondary | ICD-10-CM

## 2022-09-03 DIAGNOSIS — R2689 Other abnormalities of gait and mobility: Secondary | ICD-10-CM

## 2022-09-03 DIAGNOSIS — M25672 Stiffness of left ankle, not elsewhere classified: Secondary | ICD-10-CM

## 2022-09-03 MED ORDER — HYDROCODONE-ACETAMINOPHEN 5-325 MG PO TABS: 1.0000 | ORAL_TABLET | Freq: Three times a day (TID) | ORAL | 0 refills | Status: DC | PRN

## 2022-09-03 NOTE — Therapy (Signed)
OUTPATIENT PHYSICAL THERAPY TREATMENT   Patient Name: Jasmine Mcdaniel MRN: [id number] DOB:Oct 06, 1976, 46 y.o., female Today's Date: 09/03/2022   PT End of Session - 09/03/22 1454     Visit Number 9    Number of Visits 17    Date for PT Re-Evaluation 09/17/22    Authorization Type [employer]    Authorization Time Period 08/13/22 - 02/09/23    Authorization - Visit Number 5    Authorization - Number of Visits 66    PT Start Time 7846   Pt arrived late   PT Stop Time 1534    PT Time Calculation (min) 40 min    Activity Tolerance Patient tolerated treatment well    Behavior During Therapy WFL for tasks assessed/performed                     Past Medical History:  Diagnosis Date   GERD (gastroesophageal reflux disease)    History of migraine headaches    Hypertension    Past Surgical History:  Procedure Laterality Date   LEG SURGERY Left    ORIF ANKLE FRACTURE Left 05/25/2022   Procedure: OPEN REDUCTION INTERNAL FIXATION (ORIF) LEFT LATERAL MALLEOLUS ANKLE FRACTURE;  Surgeon: Mcarthur Rossetti, MD;  Location: [facility];  Service: Orthopedics;  Laterality: Left;   WISDOM TOOTH EXTRACTION     Patient Active Problem List   Diagnosis Date Noted   Closed left ankle fracture 05/25/2022   Closed displaced fracture of lateral malleolus of left fibula 05/23/2022    PCP: Virginia Rochester, PA  REFERRING PROVIDER: Mcarthur Rossetti, MD  REFERRING DIAG: [phone] (ICD-10-CM) - S/P ORIF (open reduction internal fixation) fracture  THERAPY DIAG:  Pain in left ankle and joints of left foot  Stiffness of left ankle, not elsewhere classified  Other abnormalities of gait and mobility  Difficulty in walking, not elsewhere classified  Unsteadiness on feet  Other symptoms and signs involving the musculoskeletal system  RATIONALE FOR EVALUATION AND TREATMENT: Rehabilitation  ONSET DATE: 05/25/2022 - ORIF left lateral malleolus ankle fracture    NEXT MD VISIT:  09/10/22   SUBJECTIVE:   SUBJECTIVE STATEMENT: Pt reports her ankle locked up on her this morning and she fell down the last 2 stairs.   PAIN:  Are you having pain? Yes: NPRS scale:  7/10 Pain location: L medial ankle Pain description: throbbing  PERTINENT HISTORY: 05/25/2022 - ORIF left lateral malleolus ankle fracture (05/20/22); HTN, migraines, GERD, LBP  PRECAUTIONS: None  WEIGHT BEARING RESTRICTIONS: Yes WBAT on R  FALLS:  Has patient fallen in last 6 months? Yes. 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  PATIENT SURVEYS:  LEFS  32 / 80 = 40.0 %  (as of 07/27/22)  COGNITION: Overall cognitive status: Within functional limits for tasks assessed     SENSATION: WFL Except numbness and occasional itching or burning along surgical incision  EDEMA:  Significant imprint visible from lace-up ankle brace with proximal and distal mild/mod edema.  MUSCLE LENGTH: Hamstrings: very mild tightness B ITB: mild tight B Piriformis: mod tight B Hip flexors: mild tight B Quads: mild tight B Heelcord: mild tight on R mod/severe tight on L  POSTURE:   B pes planus  PALPATION: Decreased scar mobility L lateral ankle surgical incision  LOWER EXTREMITY ROM:  Active ROM Right eval Left eval Left 08/13/22 Left 08/20/22  Ankle dorsiflexion +9 -5 -1 2  Ankle plantarflexion 45 28 42 55  Ankle inversion 36 _0 Ankle eversion _1 Passive ROM Left eval Left 08/30/22  Ankle dorsiflexion -2 10  Ankle plantarflexion 34   Ankle inversion 14   Ankle eversion 11    (Blank rows = not tested)  LOWER EXTREMITY MMT:  MMT Right eval Left eval Right 08/30/22 Left 08/30/22  Hip flexion 4+ _2 Hip extension 5 4+ 5 4+  Hip abduction 4+ 4 4+ 4+  Hip adduction 4 4- 4+ 4  Hip internal rotation 4+ 4+ 5 4+  Hip external rotation 4 3+ 4 4-  Knee flexion 5 4-  4+  Knee extension 5 4  5-  Ankle dorsiflexion 5 2+  4  Ankle plantarflexion  3-  4 NWB  Ankle inversion 5 2+  3+  Ankle eversion 5 2+  3+   (Blank rows = not tested)  GAIT: Distance walked: 60 ft Assistive device utilized: Single point cane and None Level of assistance: Complete Independence and Modified independence Gait pattern: decreased step length- Right, decreased stance time- Left, decreased ankle dorsiflexion- Left, and antalgic Comments: pt limping upon arrival w/o AD - somewhat better with SPC but still with decreased wt shift to L and decreased R step length   TODAY'S TREATMENT:  09/03/22 THERAPEUTIC EXERCISE: to improve flexibility, strength and mobility.  Verbal and tactile cues throughout for technique. Rec Bike - L6 x 6 min Eccentric step-down/lowering with light toe tap to floor:  - R/L lateral from 6" x 10 each  - R SLS fwd L toe tap from 6" step x 10  - L SLS fwd R toe tap from 4" step x 10 L fwd step-over with 4" step x 10 B negative heel gastroc stretch over edge of 4" step x 30" B negative heel raise on edge of 4" step x 10 L gastroc and soleus stretches (runner's stretches) 2 x 30 each  L gastroc and soleus stretches (runner's stretches +  GTB posterior tibial glide) 2 x 30 each   08/30/22 THERAPEUTIC EXERCISE: to improve flexibility, strength and mobility.  Verbal and tactile cues throughout for technique. Rec Bike - L4 x 6 min L ankle 4-way with GTB x 10 each direction L toe curls into GTB 2 x 10 L SLS 5 x 10 sec - 1st 3 reps with UE support on back of chair, last 2 reps with 2 pole A  THERAPEUTIC ACTIVITIES: MMT Goal assessment  GAIT TRAINING: To normalize gait pattern.  200 ft focusing on heel strike and heel-toe progression - 1st 90 ft with SPC on R, progressing to no AD   08/22/22 Therapeutic Exercise: Bike [id number] [employer] pe[employer] x 10 Lateral  weight shifts x 10 Retro step x 10 L LE retro  Manual Therapy: STM to L peroneals, tib anterior, gastroc/soleus   PATIENT EDUCATION:  Education details: HEP progression - 4-way ankle progressed to GTB qod and gait training to normalize gait pattern Person educated: Patient Education method: Explanation, Demonstration, and Handouts Education comprehension: verbalized understanding, returned demonstration, and needs further education   HOME EXERCISE PROGRAM: Access Code: [email or web address] URL: [email or web address] Date: 08/22/2022 Prepared by: Clarene Essex  Exercises - Long Sitting Calf Stretch with Strap  - 2-3 x daily - 7 x weekly - 3 reps - 30 sec hold - Long Sitting Soleus Stretch on Bolster with Strap (Mirrored)  - 2-3 x daily - 7 x weekly - 3 reps - 30 sec hold - Long Sitting Ankle Pumps (Mirrored)  - 2-3 x daily - 7 x weekly - 2 sets - 10 reps - 3 sec hold - Supine Ankle Circles  - 2-3 x daily - 7 x weekly - 2 sets - 10 reps - Long Sitting Ankle Plantar Flexion with Resistance  - 1 x daily - 3 x weekly - 2 sets - 10 reps - Long Sitting Ankle Eversion with Resistance  - 1 x daily - 3 x weekly - 2 sets - 10 reps - Long Sitting Ankle Dorsiflexion with Anchored Resistance  - 1 x daily - 3 x weekly - 2 sets - 10 reps - Standing Gastroc Stretch at Counter  -  2 x daily - 7 x weekly - 2 sets - 30 sec hold - [place]  - 1 x daily - 7 x weekly - 3 sets - 10 reps - Retro Step  - 1 x daily - 7 x weekly - 3 sets - 10 reps  Patient Education - Scar Massage  ASSESSMENT:  CLINICAL IMPRESSION: Carisha reports her ankle locked up on her while walking down the steps this morning causing her to fall down the last 2 steps - she denies any injury. Therapeutic exercises focusing on flexibility and strengthening to improve ROM and control with stair negotiation. Limited eccentric strength/control but no evidence of ankle locking up during eccentric step-down exercises.Pt noting anterior ankle pressure preventing her from getting effective soleus > gastroc stretch, but better tolerated with GTB posterior tibial glide. Michaila will continue to benefit from skilled PT to restore functional L ankle ROM and LE strength to allow for normal gait and stair negotiation w/o limitation due to L ankle pain or locking sensation.   OBJECTIVE IMPAIRMENTS: Abnormal gait, decreased activity tolerance, decreased balance, decreased coordination, decreased endurance, decreased knowledge of condition, decreased knowledge of use of DME, decreased mobility, difficulty walking, decreased ROM, decreased strength, decreased safety awareness, increased edema, increased fascial restrictions, impaired perceived functional ability, increased muscle spasms, impaired flexibility, impaired sensation, improper body mechanics, postural dysfunction, and pain.   ACTIVITY LIMITATIONS: standing, squatting, stairs, transfers, and locomotion level  PARTICIPATION LIMITATIONS: meal prep, cleaning, laundry, driving, shopping, community activity, occupation, and yard work  PERSONAL FACTORS: Past/current experiences, Profession, Time since onset of injury/illness/exacerbation, and 3+ comorbidities: HTN, migraines, GERD, LBP  are also affecting patient's functional outcome.   REHAB POTENTIAL:  Good  CLINICAL DECISION MAKING: Stable/uncomplicated  EVALUATION COMPLEXITY: Low   GOALS: Goals reviewed with patient? Yes  SHORT TERM GOALS: Target date: 08/20/2022   Patient will be independent with initial HEP. Baseline: Initial HEP provided on eval Goal status: MET - 08/06/22  2.  Patient will demonstrate improved L ankle DF AROM to >/= neutral  to improve gait mechanics. Baseline: Refer to above AROM chart Goal status: MET - 08/20/22  3.  Patient will ambulate with LRAD demonstrating step-through pattern and improved gait mechanics including normal heel-toe progression on L w/o increased L ankle pain. Baseline: Antalgic gait with decreased weight shift to L, decreased stance time on L and decreased R step length Goal status: MET - 08/20/22 - Pt able to demonstrate good heel toe progression on L with more symmetrical step pattern using SPC, but not quite as fluid pattern w/o AD   LONG TERM GOALS: Target date: 09/17/2022   Patient will be independent with advanced/ongoing HEP to improve outcomes and carryover.  Baseline: Initial HEP provided on eval Goal status: IN PROGRESS  2.  Patient will report at least 75% improvement in L ankle pain to improve QOL. Baseline: 9/10 Goal status: IN PROGRESS  3.  Patient will demonstrate improved L ankle AROM to [facility] with at least 10 DF to allow for normal gait and stair mechanics. Baseline: Refer to above AROM chart - AROM DF lacking 5 from neutral Goal status: IN PROGRESS - 08/30/22 - DF AROM to 4, PROM to 10  4.  Patient will demonstrate improved B LE strength to >/= 4+/5 with L ankle PF at least 3/5 for improved stability and ease of mobility . Baseline: Refer to above MMT chart Goal status: IN PROGRESS - 08/30/22 - Overall MMT improved by at least 1 MMT grade   5.  Patient will be able to ambulate 600' with LRAD and normal gait pattern without increased foot/ankle pain to access community.  Baseline: Antalgic gait with decreased weight  shift to L, decreased stance time on L and decreased R step length Goal status: IN PROGRESS  6. Patient will be able to ascend/descend stairs with 1 HR and reciprocal step pattern safely to access home and community.  Baseline: NT on eval Goal status: IN PROGRESS  7.  Patient will report at least 9 point improvement on LEFS to demonstrate improved functional ability. Baseline: 32 / 80 = 40.0 % Goal status: IN PROGRESS  8.  Patient will demonstrate at least 19/24 on DGI to decrease risk of falls. Baseline: NT on eval Goal status: IN PROGRESS    PLAN: PT FREQUENCY: 2x/week  PT DURATION: 8 weeks  PLANNED INTERVENTIONS: Therapeutic exercises, Therapeutic activity, Neuromuscular re-education, Balance training, Gait training, Patient/Family education, Self Care, Joint mobilization, Stair training, DME instructions, Dry Needling, Electrical stimulation, Cryotherapy, Moist heat, Taping, Vasopneumatic device, Ultrasound, Ionotophoresis 17m/ml Dexamethasone, Manual therapy, and Re-evaluation  PLAN FOR NEXT SESSION: 10th visit/MD PN for appt on 08/1822; Progress WB and weight shifting; gently progress L ankle ROM and strengthening - update HEP; gait training to reinforce weight shift to L with good heel-toe progression    JPercival Spanish PT 09/03/2022, 8:10 PM

## 2022-09-03 NOTE — Telephone Encounter (Signed)
Refill on hydrocodone.   Cb [phone] [id number]

## 2022-09-05 ENCOUNTER — Ambulatory Visit: Payer: Medicaid Other

## 2022-09-05 DIAGNOSIS — R262 Difficulty in walking, not elsewhere classified: Secondary | ICD-10-CM

## 2022-09-05 DIAGNOSIS — M25572 Pain in left ankle and joints of left foot: Secondary | ICD-10-CM

## 2022-09-05 DIAGNOSIS — R29898 Other symptoms and signs involving the musculoskeletal system: Secondary | ICD-10-CM

## 2022-09-05 DIAGNOSIS — R2689 Other abnormalities of gait and mobility: Secondary | ICD-10-CM

## 2022-09-05 DIAGNOSIS — M25672 Stiffness of left ankle, not elsewhere classified: Secondary | ICD-10-CM

## 2022-09-05 DIAGNOSIS — R2681 Unsteadiness on feet: Secondary | ICD-10-CM

## 2022-09-05 NOTE — Therapy (Addendum)
OUTPATIENT PHYSICAL THERAPY TREATMENT   Patient Name: Jasmine Mcdaniel MRN: [id number] DOB:Jul 01, 1976, 46 y.o., female Today's Date: 09/05/2022   PT End of Session - 09/05/22 1702     Visit Number 10    Number of Visits 17    Date for PT Re-Evaluation 09/17/22    Authorization Type [employer]    Authorization Time Period 08/13/22 - 02/09/23    Authorization - Visit Number 6    Authorization - Number of Visits 82    PT Start Time 1620    PT Stop Time 1700    PT Time Calculation (min) 40 min    Activity Tolerance Patient tolerated treatment well    Behavior During Therapy WFL for tasks assessed/performed                      Past Medical History:  Diagnosis Date   GERD (gastroesophageal reflux disease)    History of migraine headaches    Hypertension    Past Surgical History:  Procedure Laterality Date   LEG SURGERY Left    ORIF ANKLE FRACTURE Left 05/25/2022   Procedure: OPEN REDUCTION INTERNAL FIXATION (ORIF) LEFT LATERAL MALLEOLUS ANKLE FRACTURE;  Surgeon: Mcarthur Rossetti, MD;  Location: [facility];  Service: Orthopedics;  Laterality: Left;   WISDOM TOOTH EXTRACTION     Patient Active Problem List   Diagnosis Date Noted   Closed left ankle fracture 05/25/2022   Closed displaced fracture of lateral malleolus of left fibula 05/23/2022    PCP: Virginia Rochester, PA  REFERRING PROVIDER: Mcarthur Rossetti, MD  REFERRING DIAG: [phone] (ICD-10-CM) - S/P ORIF (open reduction internal fixation) fracture  THERAPY DIAG:  Pain in left ankle and joints of left foot  Stiffness of left ankle, not elsewhere classified  Other abnormalities of gait and mobility  Difficulty in walking, not elsewhere classified  Unsteadiness on feet  Other symptoms and signs involving the musculoskeletal system  RATIONALE FOR EVALUATION AND TREATMENT: Rehabilitation  ONSET DATE: 05/25/2022 - ORIF left lateral malleolus ankle fracture   NEXT MD VISIT:   09/19/22   SUBJECTIVE:   SUBJECTIVE STATEMENT: Pt reports she is doing better today than last visit.  PAIN:  Are you having pain? Yes: NPRS scale: 5/10 Pain location: L medial ankle Pain description: throbbing  PERTINENT HISTORY: 05/25/2022 - ORIF left lateral malleolus ankle fracture (05/20/22); HTN, migraines, GERD, LBP  PRECAUTIONS: None  WEIGHT BEARING RESTRICTIONS: Yes WBAT on R  FALLS:  Has patient fallen in last 6 months? Yes. 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  PATIENT SURVEYS:  LEFS 32 / 80 = 40.0 %  (as of 07/27/22)  COGNITION: Overall  cognitive status: Within functional limits for tasks assessed     SENSATION: WFL Except numbness and occasional itching or burning along surgical incision  EDEMA:  Significant imprint visible from lace-up ankle brace with proximal and distal mild/mod edema.  MUSCLE LENGTH: Hamstrings: very mild tightness B ITB: mild tight B Piriformis: mod tight B Hip flexors: mild tight B Quads: mild tight B Heelcord: mild tight on R mod/severe tight on L  POSTURE:  B pes planus  PALPATION: Decreased scar mobility L  lateral ankle surgical incision  LOWER EXTREMITY ROM:  Active ROM Right eval Left eval Left 08/13/22 Left 08/20/22  Ankle dorsiflexion +9 -5 -1 2  Ankle plantarflexion 45 28 42 55  Ankle inversion 36 _0 Ankle eversion _1 Passive ROM Left eval Left 08/30/22  Ankle dorsiflexion -2 10  Ankle plantarflexion 34   Ankle inversion 14   Ankle eversion 11    (Blank rows = not tested)  LOWER EXTREMITY MMT:  MMT Right eval Left eval Right 08/30/22 Left 08/30/22  Hip flexion 4+ _2 Hip extension 5 4+ 5 4+  Hip abduction 4+ 4 4+ 4+  Hip adduction 4 4- 4+ 4  Hip internal rotation 4+ 4+ 5 4+  Hip external rotation 4 3+ 4 4-  Knee flexion 5 4-  4+  Knee extension 5 4  5-  Ankle dorsiflexion 5 2+  4  Ankle plantarflexion  3-  4 NWB  Ankle inversion 5 2+  3+  Ankle eversion 5 2+  3+   (Blank rows = not tested)  GAIT: Distance walked: 60 ft Assistive device utilized: Single point cane and None Level of assistance: Complete Independence and Modified independence Gait pattern: decreased step length- Right, decreased stance time- Left, decreased ankle dorsiflexion- Left, and antalgic Comments: pt limping upon arrival w/o AD - somewhat better with SPC but still with decreased wt shift to L and decreased R step length   TODAY'S TREATMENT: 09/05/22 TherEx: Bike L6x63mn Calf press 25lb x 20  R ecc step down x 10 6' L SLS opp LE reach to colored dots 360 pattern 5 x Rockerboard fwd/back x 10 Step ups on BOSU x 5 bil - last rep DLS balance on BOSU for x 15 sec Standing L gastroc stretch with pro stretch x 30 sec  09/03/22 THERAPEUTIC EXERCISE: to improve flexibility, strength and mobility.  Verbal and tactile cues throughout for technique. Rec Bike - L6 x 6 min Eccentric step-down/lowering with light toe tap to floor:  - R/L lateral from 6" x 10 each  - R SLS fwd L toe tap from 6" step x 10  - L SLS fwd R toe tap from 4" step x 10 L fwd step-over with 4" step x  10 B negative heel gastroc stretch over edge of 4" step x 30" B negative heel raise on edge of 4" step x 10 L gastroc and soleus stretches (runner's stretches) 2 x 30 each  L gastroc and soleus stretches (runner's stretches + GTB posterior tibial glide) 2 x 30 each   08/30/22 THERAPEUTIC EXERCISE: to improve flexibility, strength and mobility.  Verbal and tactile cues throughout for technique. Rec Bike - L4 x 6 min L ankle 4-way with GTB x 10 each direction L toe curls into GTB 2 x 10 L SLS 5 x 10 sec - 1st 3 reps with UE support on back of chair, last 2 reps with 2 pole  A  THERAPEUTIC ACTIVITIES: MMT Goal assessment  GAIT TRAINING: To normalize gait pattern.  200 ft focusing on heel strike and heel-toe progression - 1st 90 ft with SPC on R, progressing to no AD   08/22/22 Therapeutic Exercise: Bike L4x65mn [employer] pe[employer] x 10 Lateral weight shifts x 10 Retro step x 10 L LE retro  Manual Therapy: STM to L peroneals, tib anterior, gastroc/soleus   PATIENT EDUCATION:  Education details: HEP progression - 4-way ankle progressed to GTB qod and gait training to normalize gait pattern Person educated: Patient Education method: Explanation, Demonstration, and Handouts Education comprehension: verbalized understanding, returned demonstration, and needs further education   HOME EXERCISE PROGRAM: Access Code: [email or web address] URL: [email or web address] Date: 08/22/2022 Prepared by: BClarene Essex Exercises - Long Sitting Calf Stretch with Strap  - 2-3 x daily - 7 x weekly - 3 reps - 30 sec hold - Long Sitting Soleus Stretch on Bolster with Strap (Mirrored)  - 2-3 x daily - 7 x weekly - 3 reps - 30 sec hold - Long Sitting Ankle Pumps (Mirrored)  - 2-3 x daily - 7 x weekly - 2 sets - 10 reps - 3 sec hold - Supine Ankle Circles  - 2-3 x daily - 7 x weekly - 2 sets - 10 reps - Long Sitting Ankle Plantar Flexion with Resistance  - 1 x daily - 3 x weekly - 2 sets - 10 reps - Long  Sitting Ankle Eversion with Resistance  - 1 x daily - 3 x weekly - 2 sets - 10 reps - Long Sitting Ankle Dorsiflexion with Anchored Resistance  - 1 x daily - 3 x weekly - 2 sets - 10 reps - Standing Gastroc Stretch at Counter  - 2 x daily - 7 x weekly - 2 sets - 30 sec hold - [employer] [place]  - 1 x daily - 7 x weekly - 3 sets - 10 reps - Retro Step  - 1 x daily - 7 x weekly - 3 sets - 10 reps  Patient Education - Scar Massage  ASSESSMENT:  CLINICAL IMPRESSION: Pt reports improvement in pain levels today. She still has issues with stairs mostly with descending with R LE. She was walking fine today, did not notice much antalgic gait w/o cane. She was able to continue with progressing WB exercises w/o increases in pain. Continues to be challenged by step downs and activities that require SLS on L. MD visit for 9/18 has also changed to 9/27. Pt demonstrates improvements in strength and ROM per measurements taken last visit, she would continue to benefit from skilled therapy.  OBJECTIVE IMPAIRMENTS: Abnormal gait, decreased activity tolerance, decreased balance, decreased coordination, decreased endurance, decreased knowledge of condition, decreased knowledge of use of DME, decreased mobility, difficulty walking, decreased ROM, decreased strength, decreased safety awareness, increased edema, increased fascial restrictions, impaired perceived functional ability, increased muscle spasms, impaired flexibility, impaired sensation, improper body mechanics, postural dysfunction, and pain.   ACTIVITY LIMITATIONS: standing, squatting, stairs, transfers, and locomotion level  PARTICIPATION LIMITATIONS: meal prep, cleaning, laundry, driving, shopping, community activity, occupation, and yard work  PERSONAL FACTORS: Past/current experiences, Profession, Time since onset of injury/illness/exacerbation, and 3+ comorbidities: HTN, migraines, GERD, LBP  are also affecting patient's functional outcome.   REHAB  POTENTIAL: Good  CLINICAL DECISION MAKING: Stable/uncomplicated  EVALUATION COMPLEXITY: Low   GOALS: Goals reviewed with patient? Yes  SHORT TERM GOALS: Target date: 08/20/2022   Patient will be independent with initial HEP. Baseline: Initial HEP provided  on eval Goal status: MET - 08/06/22  2.  Patient will demonstrate improved L ankle DF AROM to >/= neutral to improve gait mechanics. Baseline: Refer to above AROM chart Goal status: MET - 08/20/22  3.  Patient will ambulate with LRAD demonstrating step-through pattern and improved gait mechanics including normal heel-toe progression on L w/o increased L ankle pain. Baseline: Antalgic gait with decreased weight shift to L, decreased stance time on L and decreased R step length Goal status: MET - 08/20/22 - Pt able to demonstrate good heel toe progression on L with more symmetrical step pattern using SPC, but not quite as fluid pattern w/o AD   LONG TERM GOALS: Target date: 09/17/2022   Patient will be independent with advanced/ongoing HEP to improve outcomes and carryover.  Baseline: Initial HEP provided on eval Goal status: IN PROGRESS  2.  Patient will report at least 75% improvement in L ankle pain to improve QOL. Baseline: 9/10 Goal status: IN PROGRESS  3.  Patient will demonstrate improved L ankle AROM to [facility] with at least 10 DF to allow for normal gait and stair mechanics. Baseline: Refer to above AROM chart - AROM DF lacking 5 from neutral Goal status: IN PROGRESS - 08/30/22 - DF AROM to 4, PROM to 10  4.  Patient will demonstrate improved B LE strength to >/= 4+/5 with L ankle PF at least 3/5 for improved stability and ease of mobility . Baseline: Refer to above MMT chart Goal status: IN PROGRESS - 08/30/22 - Overall MMT improved by at least 1 MMT grade   5.  Patient will be able to ambulate 600' with LRAD and normal gait pattern without increased foot/ankle pain to access community.  Baseline: Antalgic gait with  decreased weight shift to L, decreased stance time on L and decreased R step length Goal status: IN PROGRESS  6. Patient will be able to ascend/descend stairs with 1 HR and reciprocal step pattern safely to access home and community.  Baseline: NT on eval Goal status: IN PROGRESS - 09/05/22, still having trouble with going downstairs d/t mobility in L ankle  7.  Patient will report at least 9 point improvement on LEFS to demonstrate improved functional ability. Baseline: 32 / 80 = 40.0 % Goal status: IN PROGRESS  8.  Patient will demonstrate at least 19/24 on DGI to decrease risk of falls. Baseline: NT on eval Goal status: IN PROGRESS    PLAN: PT FREQUENCY: 2x/week  PT DURATION: 8 weeks  PLANNED INTERVENTIONS: Therapeutic exercises, Therapeutic activity, Neuromuscular re-education, Balance training, Gait training, Patient/Family education, Self Care, Joint mobilization, Stair training, DME instructions, Dry Needling, Electrical stimulation, Cryotherapy, Moist heat, Taping, Vasopneumatic device, Ultrasound, Ionotophoresis 21m/ml Dexamethasone, Manual therapy, and Re-evaluation  PLAN FOR NEXT SESSION: ; Progress WB and weight shifting; gently progress L ankle ROM and strengthening - update HEP; gait training to reinforce weight shift to L with good heel-toe progression    BArtist Pais PTA 09/05/2022, 5:05 PM

## 2022-09-10 ENCOUNTER — Encounter: Payer: Self-pay | Admitting: Physical Therapy

## 2022-09-10 ENCOUNTER — Ambulatory Visit: Payer: Medicaid Other | Admitting: Orthopaedic Surgery

## 2022-09-10 ENCOUNTER — Ambulatory Visit: Payer: Medicaid Other | Admitting: Physical Therapy

## 2022-09-10 DIAGNOSIS — M25572 Pain in left ankle and joints of left foot: Secondary | ICD-10-CM

## 2022-09-10 DIAGNOSIS — R2689 Other abnormalities of gait and mobility: Secondary | ICD-10-CM

## 2022-09-10 DIAGNOSIS — R262 Difficulty in walking, not elsewhere classified: Secondary | ICD-10-CM

## 2022-09-10 DIAGNOSIS — R29898 Other symptoms and signs involving the musculoskeletal system: Secondary | ICD-10-CM

## 2022-09-10 DIAGNOSIS — M25672 Stiffness of left ankle, not elsewhere classified: Secondary | ICD-10-CM

## 2022-09-10 DIAGNOSIS — R2681 Unsteadiness on feet: Secondary | ICD-10-CM

## 2022-09-10 NOTE — Therapy (Signed)
OUTPATIENT PHYSICAL THERAPY TREATMENT   Patient Name: Jasmine Mcdaniel MRN: [id number] DOB:10/25/1976, 46 y.o., female Today's Date: 09/10/2022   PT End of Session - 09/10/22 1715     Visit Number 11    Number of Visits 17    Date for PT Re-Evaluation 09/17/22    Authorization Type [employer]    Authorization Time Period 08/13/22 - 02/09/23    Authorization - Visit Number 7    Authorization - Number of Visits 35    PT Start Time 4970   Pt arrived late   PT Stop Time 2637    PT Time Calculation (min) 38 min    Activity Tolerance Patient tolerated treatment well    Behavior During Therapy WFL for tasks assessed/performed                       Past Medical History:  Diagnosis Date   GERD (gastroesophageal reflux disease)    History of migraine headaches    Hypertension    Past Surgical History:  Procedure Laterality Date   LEG SURGERY Left    ORIF ANKLE FRACTURE Left 05/25/2022   Procedure: OPEN REDUCTION INTERNAL FIXATION (ORIF) LEFT LATERAL MALLEOLUS ANKLE FRACTURE;  Surgeon: Mcarthur Rossetti, MD;  Location: [facility];  Service: Orthopedics;  Laterality: Left;   WISDOM TOOTH EXTRACTION     Patient Active Problem List   Diagnosis Date Noted   Closed left ankle fracture 05/25/2022   Closed displaced fracture of lateral malleolus of left fibula 05/23/2022    PCP: Virginia Rochester, PA  REFERRING PROVIDER: Mcarthur Rossetti, MD  REFERRING DIAG: [phone] (ICD-10-CM) - S/P ORIF (open reduction internal fixation) fracture  THERAPY DIAG:  Pain in left ankle and joints of left foot  Stiffness of left ankle, not elsewhere classified  Other abnormalities of gait and mobility  Difficulty in walking, not elsewhere classified  Unsteadiness on feet  Other symptoms and signs involving the musculoskeletal system  RATIONALE FOR EVALUATION AND TREATMENT: Rehabilitation  ONSET DATE: 05/25/2022 - ORIF left lateral malleolus ankle fracture    NEXT MD VISIT:  09/19/22   SUBJECTIVE:   SUBJECTIVE STATEMENT: Pt reports she has been walking more today and her pain is elevated as a result. She states she has started water workouts at the gym - just walking in the water so far.  PAIN:  Are you having pain? Yes: NPRS scale:  7/10 Pain location: L posterior-medial ankle Pain description: throbbing  PERTINENT HISTORY: 05/25/2022 - ORIF left lateral malleolus ankle fracture (05/20/22); HTN, migraines, GERD, LBP  PRECAUTIONS: None  WEIGHT BEARING RESTRICTIONS: Yes WBAT on R  FALLS:  Has patient fallen in last 6 months? Yes. 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  PATIENT SURVEYS:  LEFS 32 / 80 = 40.0 %  (as of 07/27/22)  COGNITION: Overall cognitive status: Within functional limits for tasks assessed     SENSATION: WFL Except numbness and occasional itching or burning along surgical incision  EDEMA:  Significant imprint visible from lace-up ankle brace with proximal and distal mild/mod edema.  MUSCLE LENGTH: Hamstrings: very mild tightness B ITB: mild tight B Piriformis: mod tight B Hip flexors:  mild tight B Quads: mild tight B Heelcord: mild tight on R mod/severe tight on L  POSTURE:  B pes planus  PALPATION: Decreased scar mobility L lateral ankle surgical incision  LOWER EXTREMITY ROM:  Active ROM Right eval Left eval Left 08/13/22 Left 08/20/22 Left  09/10/22  Ankle dorsiflexion +9 -5 -1 2 11   Ankle plantarflexion 45 28 42 55 48  Ankle inversion 36 11 20 22 25   Ankle eversion 25 5 9 15 20     Passive ROM Left eval Left 08/30/22  Ankle dorsiflexion -2 10  Ankle plantarflexion 34   Ankle inversion 14   Ankle eversion 11    (Blank rows = not tested)  LOWER EXTREMITY MMT:  MMT Right eval Left eval Right 08/30/22 Left 08/30/22  Hip flexion 4+ 4 5 5   Hip extension 5 4+ 5 4+  Hip abduction 4+ 4 4+ 4+  Hip adduction 4 4- 4+ 4  Hip internal rotation 4+ 4+ 5 4+  Hip external rotation 4 3+ 4 4-  Knee flexion 5 4-  4+  Knee extension 5 4  5-  Ankle dorsiflexion 5 2+  4  Ankle plantarflexion  3-  4 NWB  Ankle inversion 5 2+  3+  Ankle eversion 5 2+  3+   (Blank rows = not tested)  GAIT: Distance walked: 60 ft Assistive device utilized: Single point cane and None Level of assistance: Complete Independence and Modified independence Gait pattern: decreased step length- Right, decreased stance time- Left, decreased ankle dorsiflexion- Left, and antalgic Comments: pt limping upon arrival w/o AD - somewhat better with SPC but still with decreased wt shift to L and decreased R step length   TODAY'S TREATMENT:  09/10/22 THERAPEUTIC EXERCISE: to improve flexibility, strength and mobility.  Verbal and tactile cues throughout for technique. Rec Bike - L6 x 6 min L lateral eccentric lowering from 4" step with light heel tap x 10, from 6" step x 10 L forward eccentric lowering from 4" step with light heel tap x 10, from 6" step x 10 L forward step-up/R foot over 4" step x 10 Towel assisted L ankle inversion/eversion mobilization 10 x 5" + 2 x 30" hold for peroneal  stretch  MANUAL THERAPY: To promote normalized muscle tension, improved flexibility, and increased ROM. STM & IASTM with roller stick to L peroneals and gastroc soleus  SELF CARE: Provided instruction in use of foam roller or rolling pin for self-STM   09/05/22 TherEx: Bike L6x74mn Calf press 25lb x 20  R ecc step down x 10 6' L SLS opp LE reach to colored dots 360 pattern 5 x Rockerboard fwd/back x 10 Step ups on BOSU x 5 bil - last rep DLS balance on BOSU for x 15 sec Standing L gastroc stretch with pro stretch x 30 sec   09/03/22 THERAPEUTIC EXERCISE: to improve flexibility, strength and mobility.  Verbal and tactile cues throughout for technique. Rec Bike - L6 x 6 min Eccentric  step-down/lowering with light toe tap to floor:  - R/L lateral from 6" x 10 each  - R SLS fwd L toe tap from 6" step x 10  - L SLS fwd R toe tap from 4" step x 10 L fwd step-over with 4" step x 10 B negative heel gastroc stretch over edge of 4" step x 30" B negative heel raise on edge of 4" step x 10 L gastroc and soleus stretches (runner's stretches) 2 x 30 each  L gastroc and soleus stretches (runner's stretches + GTB posterior tibial glide) 2 x 30 each   08/30/22 THERAPEUTIC EXERCISE: to improve flexibility, strength and mobility.  Verbal and tactile cues throughout for technique. Rec Bike - L4 x 6 min L ankle 4-way with GTB x 10 each direction L toe curls into GTB 2 x 10 L SLS 5 x 10 sec - 1st 3 reps with UE support on back of chair, last 2 reps with 2 pole A  THERAPEUTIC ACTIVITIES: MMT Goal assessment  GAIT TRAINING: To normalize gait pattern.  200 ft focusing on heel strike and heel-toe progression - 1st 90 ft with SPC on R, progressing to no AD   08/22/22 Therapeutic Exercise: Bike L4x56mn [employer] pews x 10 Lateral weight shifts x 10 Retro step x 10 L LE retro  Manual Therapy: STM to L peroneals, tib anterior, gastroc/soleus   PATIENT EDUCATION:  Education details: HEP  progression - 4-way ankle progressed to GTB qod and gait training to normalize gait pattern Person educated: Patient Education method: Explanation, Demonstration, and Handouts Education comprehension: verbalized understanding, returned demonstration, and needs further education   HOME EXERCISE PROGRAM: Access Code: [email or web address] URL: [email or web address] Date: 08/22/2022 Prepared by: BClarene Essex Exercises - Long Sitting Calf Stretch with Strap  - 2-3 x daily - 7 x weekly - 3 reps - 30 sec hold - Long Sitting Soleus Stretch on Bolster with Strap (Mirrored)  - 2-3 x daily - 7 x weekly - 3 reps - 30 sec hold - Long Sitting Ankle Pumps (Mirrored)  - 2-3 x daily - 7 x weekly - 2 sets - 10 reps - 3 sec hold - Supine Ankle Circles  - 2-3 x daily - 7 x weekly - 2 sets - 10 reps - Long Sitting Ankle Plantar Flexion with Resistance  - 1 x daily - 3 x weekly - 2 sets - 10 reps - Long Sitting Ankle Eversion with Resistance  - 1 x daily - 3 x weekly - 2 sets - 10 reps - Long Sitting Ankle Dorsiflexion with Anchored Resistance  - 1 x daily - 3 x weekly - 2 sets - 10 reps - Standing Gastroc Stretch at Counter  - 2 x daily - 7 x weekly - 2 sets - 30 sec hold - [employer] [place]  - 1 x daily - 7 x weekly - 3 sets - 10 reps - Retro Step  - 1 x daily - 7 x weekly - 3 sets - 10 reps  Patient Education - Scar Massage  ASSESSMENT:  CLINICAL IMPRESSION: CCrystellereports increased pain today after a busier day on her feet. Continued focused on strengthening and proper mechanics, esp eccentric control, with stair negotiation today with pt able to demonstrate improving eccentric control with better functional ROM. L ankle ROM continues to improve but stiffer in inversion/eversion today, therefore guided pt in self-mobilization using a folded towel for inversion/eversion ROM as well as a peroneal stretch. Increased muscle tension/TTP evident in peroneals  which was address with STM and IASTM including  instruction in self-STM using as rolling pin or foam roller. Duchess is nearing the end of her current cert period and will f/u with MD next week, therefore will continue to assess for readiness to transition to HEP vs need for recert in remaining visits.   OBJECTIVE IMPAIRMENTS: Abnormal gait, decreased activity tolerance, decreased balance, decreased coordination, decreased endurance, decreased knowledge of condition, decreased knowledge of use of DME, decreased mobility, difficulty walking, decreased ROM, decreased strength, decreased safety awareness, increased edema, increased fascial restrictions, impaired perceived functional ability, increased muscle spasms, impaired flexibility, impaired sensation, improper body mechanics, postural dysfunction, and pain.   ACTIVITY LIMITATIONS: standing, squatting, stairs, transfers, and locomotion level  PARTICIPATION LIMITATIONS: meal prep, cleaning, laundry, driving, shopping, community activity, occupation, and yard work  PERSONAL FACTORS: Past/current experiences, Profession, Time since onset of injury/illness/exacerbation, and 3+ comorbidities: HTN, migraines, GERD, LBP  are also affecting patient's functional outcome.   REHAB POTENTIAL: Good  CLINICAL DECISION MAKING: Stable/uncomplicated  EVALUATION COMPLEXITY: Low   GOALS: Goals reviewed with patient? Yes  SHORT TERM GOALS: Target date: 08/20/2022   Patient will be independent with initial HEP. Baseline: Initial HEP provided on eval Goal status: MET - 08/06/22  2.  Patient will demonstrate improved L ankle DF AROM to >/= neutral to improve gait mechanics. Baseline: Refer to above AROM chart Goal status: MET - 08/20/22  3.  Patient will ambulate with LRAD demonstrating step-through pattern and improved gait mechanics including normal heel-toe progression on L w/o increased L ankle pain. Baseline: Antalgic gait with decreased weight shift to L, decreased stance time on L and decreased R  step length Goal status: MET - 08/20/22 - Pt able to demonstrate good heel toe progression on L with more symmetrical step pattern using SPC, but not quite as fluid pattern w/o AD   LONG TERM GOALS: Target date: 09/17/2022   Patient will be independent with advanced/ongoing HEP to improve outcomes and carryover.  Baseline: Initial HEP provided on eval Goal status: IN PROGRESS  2.  Patient will report at least 75% improvement in L ankle pain to improve QOL. Baseline: 9/10 Goal status: IN PROGRESS  3.  Patient will demonstrate improved L ankle AROM to [facility] with at least 10 DF to allow for normal gait and stair mechanics. Baseline: Refer to above AROM chart - AROM DF lacking 5 from neutral Goal status: MET - 08/31/22 - DF AROM to 11  4.  Patient will demonstrate improved B LE strength to >/= 4+/5 with L ankle PF at least 3/5 for improved stability and ease of mobility . Baseline: Refer to above MMT chart Goal status: IN PROGRESS - 08/30/22 - Overall MMT improved by at least 1 MMT grade   5.  Patient will be able to ambulate 600' with LRAD and normal gait pattern without increased foot/ankle pain to access community.  Baseline: Antalgic gait with decreased weight shift to L, decreased stance time on L and decreased R step length Goal status: IN PROGRESS  6. Patient will be able to ascend/descend stairs with 1 HR and reciprocal step pattern safely to access home and community.  Baseline: NT on eval Goal status: IN PROGRESS - 09/05/22 - still having trouble with going downstairs d/t mobility in L ankle  7.  Patient will report at least 9 point improvement on LEFS to demonstrate improved functional ability. Baseline: 32 / 80 = 40.0 % Goal status: IN PROGRESS  8.  Patient will  demonstrate at least 19/24 on DGI to decrease risk of falls. Baseline: NT on eval Goal status: IN PROGRESS    PLAN: PT FREQUENCY: 2x/week  PT DURATION: 8 weeks  PLANNED INTERVENTIONS: Therapeutic exercises,  Therapeutic activity, Neuromuscular re-education, Balance training, Gait training, Patient/Family education, Self Care, Joint mobilization, Stair training, DME instructions, Dry Needling, Electrical stimulation, Cryotherapy, Moist heat, Taping, Vasopneumatic device, Ultrasound, Ionotophoresis 70m/ml Dexamethasone, Manual therapy, and Re-evaluation  PLAN FOR NEXT SESSION: ; Progress WB and weight shifting; gently progress L ankle ROM and strengthening - update HEP; gait training to reinforce weight shift to L with good heel-toe progression    JPercival Spanish PT 09/10/2022, 6:13 PM

## 2022-09-13 ENCOUNTER — Ambulatory Visit: Payer: Medicaid Other | Admitting: Physical Therapy

## 2022-09-13 ENCOUNTER — Encounter: Payer: Self-pay | Admitting: Physical Therapy

## 2022-09-13 DIAGNOSIS — M25572 Pain in left ankle and joints of left foot: Secondary | ICD-10-CM

## 2022-09-13 DIAGNOSIS — R2681 Unsteadiness on feet: Secondary | ICD-10-CM

## 2022-09-13 DIAGNOSIS — R262 Difficulty in walking, not elsewhere classified: Secondary | ICD-10-CM

## 2022-09-13 DIAGNOSIS — M25672 Stiffness of left ankle, not elsewhere classified: Secondary | ICD-10-CM

## 2022-09-13 DIAGNOSIS — R29898 Other symptoms and signs involving the musculoskeletal system: Secondary | ICD-10-CM

## 2022-09-13 DIAGNOSIS — R2689 Other abnormalities of gait and mobility: Secondary | ICD-10-CM

## 2022-09-13 NOTE — Therapy (Addendum)
OUTPATIENT PHYSICAL THERAPY TREATMENT / DISCHARGE SUMMARY   Patient Name: Jasmine Mcdaniel MRN: [id number] DOB:08-15-1976, 46 y.o., female Today's Date: 09/13/2022    PT End of Session - 09/13/22 1445     Visit Number 12    Number of Visits 17    Date for PT Re-Evaluation 09/17/22    Authorization Type [employer]    Authorization Time Period 08/13/22 - 02/09/23    Authorization - Visit Number 8    Authorization - Number of Visits 33    PT Start Time 2458    PT Stop Time 0998    PT Time Calculation (min) 45 min    Activity Tolerance Patient tolerated treatment well    Behavior During Therapy WFL for tasks assessed/performed                 Past Medical History:  Diagnosis Date   GERD (gastroesophageal reflux disease)    History of migraine headaches    Hypertension    Past Surgical History:  Procedure Laterality Date   LEG SURGERY Left    ORIF ANKLE FRACTURE Left 05/25/2022   Procedure: OPEN REDUCTION INTERNAL FIXATION (ORIF) LEFT LATERAL MALLEOLUS ANKLE FRACTURE;  Surgeon: Mcarthur Rossetti, MD;  Location: [facility];  Service: Orthopedics;  Laterality: Left;   WISDOM TOOTH EXTRACTION     Patient Active Problem List   Diagnosis Date Noted   Closed left ankle fracture 05/25/2022   Closed displaced fracture of lateral malleolus of left fibula 05/23/2022    PCP: Virginia Rochester, PA  REFERRING PROVIDER: Mcarthur Rossetti, MD  REFERRING DIAG: [phone] (ICD-10-CM) - S/P ORIF (open reduction internal fixation) fracture  THERAPY DIAG:  Pain in left ankle and joints of left foot  Stiffness of left ankle, not elsewhere classified  Other abnormalities of gait and mobility  Difficulty in walking, not elsewhere classified  Unsteadiness on feet  Other symptoms and signs involving the musculoskeletal system  RATIONALE FOR EVALUATION AND TREATMENT: Rehabilitation  ONSET DATE: 05/25/2022 - ORIF left lateral malleolus ankle fracture   NEXT  MD VISIT:  09/19/22   SUBJECTIVE:   SUBJECTIVE STATEMENT: Pt reports she is doing well today with pain having subsiding since earlier this week. Pain did keep her up a few nights ago but better since. She has been walking in the pool for exercise.  PAIN:  Are you having pain? Yes: NPRS scale:  4/10 Pain location: L posterior-medial ankle Pain description: throbbing  PERTINENT HISTORY: 05/25/2022 - ORIF left lateral malleolus ankle fracture (05/20/22); HTN, migraines, GERD, LBP  PRECAUTIONS: None  WEIGHT BEARING RESTRICTIONS: Yes WBAT on R  FALLS:  Has patient fallen in last 6 months? Yes. 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  PATIENT SURVEYS:  LEFS 32 / 80 = 40.0 %  (as of 07/27/22)  COGNITION: Overall cognitive status: Within functional limits for tasks assessed     SENSATION: WFL Except numbness and occasional itching or burning along surgical incision  EDEMA:  Significant imprint visible from lace-up ankle brace with proximal and distal mild/mod edema.  MUSCLE LENGTH: Hamstrings: very mild tightness B ITB: mild tight B Piriformis: mod tight B Hip  flexors: mild tight B Quads: mild tight B Heelcord: mild tight on R mod/severe tight on L  POSTURE:  B pes planus  PALPATION: Decreased scar mobility L lateral ankle surgical incision  LOWER EXTREMITY ROM:  Active ROM Right eval Left eval Left 08/13/22 Left 08/20/22 Left  09/10/22  Ankle dorsiflexion +9 -5 -1 2 11   Ankle plantarflexion 45 28 42 55 48  Ankle inversion 36 11 20 22 25   Ankle eversion 25 5 9 15 20     Passive ROM Left eval Left 08/30/22  Ankle dorsiflexion -2 10  Ankle plantarflexion 34   Ankle inversion 14   Ankle eversion 11    (Blank rows = not tested)  LOWER EXTREMITY MMT:  MMT Right eval Left eval Right 08/30/22 Left 08/30/22  Hip flexion 4+ 4 5 5   Hip extension 5 4+ 5 4+  Hip abduction 4+ 4 4+ 4+  Hip adduction 4 4- 4+ 4  Hip internal rotation 4+ 4+ 5 4+  Hip external rotation 4 3+ 4 4-  Knee flexion 5 4-  4+  Knee extension 5 4  5-  Ankle dorsiflexion 5 2+  4  Ankle plantarflexion  3-  4 NWB  Ankle inversion 5 2+  3+  Ankle eversion 5 2+  3+   (Blank rows = not tested)  GAIT: Distance walked: 60 ft Assistive device utilized: Single point cane and None Level of assistance: Complete Independence and Modified independence Gait pattern: decreased step length- Right, decreased stance time- Left, decreased ankle dorsiflexion- Left, and antalgic Comments: pt limping upon arrival w/o AD - somewhat better with SPC but still with decreased wt shift to L and decreased R step length   TODAY'S TREATMENT:  09/13/22 THERAPEUTIC EXERCISE: to improve flexibility, strength and mobility.  Verbal and tactile cues throughout for technique. NuStep - L6 x 6 min (UE/LE) B heel raies + ball squeeze between ankles 10 x 5"  NEUROMUSCULAR RE-EDUCATION: To improve balance and proprioception. L SLS + R 7-way tap to colored dots x 10 L SLS on blue foam oval + R 6-way tap to balance pebbles B side-step + 4 cone knock-down and righting x 2 sets Foam balance  beam: Tandem gait fwd x 4 passes, back x 2 passes B side-stepping x 4 passes Side-step + 4 cone knock-down and righting x 2 passes  MANUAL THERAPY: To promote normalized muscle tension, improved flexibility, improved joint mobility, increased ROM, and reduced pain. Scar tissue mobs to lateral ankle incision - hypersensitivity noted at mid incision STM/DTM to L peroneals  SELF CARE: Provided instruction in desensitization techniques for surgical incision hypersensitivity   09/10/22 THERAPEUTIC EXERCISE: to improve flexibility, strength and mobility.  Verbal and tactile cues throughout for technique. Rec Bike - L6 x 6 min L lateral eccentric lowering from 4" step with light heel tap x 10, from 6" step x 10 L forward eccentric lowering from 4" step with light heel tap x 10, from 6" step x 10 L forward step-up/R foot over  4" step x 10 Towel assisted L ankle inversion/eversion mobilization 10 x 5" + 2 x 30" hold for peroneal stretch  MANUAL THERAPY: To promote normalized muscle tension, improved flexibility, and increased ROM. STM & IASTM with roller stick to L peroneals and gastroc soleus  SELF CARE: Provided instruction in use of foam roller or rolling pin for self-STM   09/05/22 TherEx: Bike L6x24mn Calf press 25lb x 20  R ecc step down x 10 6' L SLS opp LE reach to colored dots 360 pattern 5 x Rockerboard fwd/back x 10 Step ups on BOSU x 5 bil - last rep DLS balance on BOSU for x 15 sec Standing L gastroc stretch with pro stretch x 30 sec   PATIENT EDUCATION:  Education details: HEP progression - 4-way ankle progressed to blue TB qod and incisional desensitization techniques Person educated: Patient Education method: Explanation Education comprehension: verbalized understanding   HOME EXERCISE PROGRAM: Access Code: [email or web address] URL: [email or web address] Date: 08/22/2022 Prepared by: BClarene Essex Exercises - Long Sitting Calf Stretch with Strap  - 2-3 x  daily - 7 x weekly - 3 reps - 30 sec hold - Long Sitting Soleus Stretch on Bolster with Strap (Mirrored)  - 2-3 x daily - 7 x weekly - 3 reps - 30 sec hold - Long Sitting Ankle Pumps (Mirrored)  - 2-3 x daily - 7 x weekly - 2 sets - 10 reps - 3 sec hold - Supine Ankle Circles  - 2-3 x daily - 7 x weekly - 2 sets - 10 reps - Long Sitting Ankle Plantar Flexion with Resistance  - 1 x daily - 3 x weekly - 2 sets - 10 reps - Long Sitting Ankle Eversion with Resistance  - 1 x daily - 3 x weekly - 2 sets - 10 reps - Long Sitting Ankle Dorsiflexion with Anchored Resistance  - 1 x daily - 3 x weekly - 2 sets - 10 reps - Standing Gastroc Stretch at Counter  - 2 x daily - 7 x weekly - 2 sets - 30 sec hold - [place]  - 1 x daily - 7 x weekly - 3 sets - 10 reps - Retro Step  - 1 x daily - 7 x weekly - 3 sets - 10 reps  Patient Education - Scar Massage  ASSESSMENT:  CLINICAL IMPRESSION: CLyllianareports her L ankle pain has improved over the course of the week but still dreads inclement weather as it seems to get worse with changes in the weather. She reports HEP is going well and she has been exercising in the pool as well - feels ready to advance resistance with ankle TB exercises, therefore blue TB provided but pt declined need for physical review. Therapeutic interventions focusing on ankle stability and proprioceptive training with increasing use of unstable surfaces. She needed a moment to get her confidence up as she would try a new activity but able to complete all therapeutic activities w/o signficant instability noted but did note more fatigue in peroneals causing ankle to feel like it wants to roll into inversion - progression of 4-way ankle resistance should help build strength endurance but also added ball squeeze to heel raises to increase peroneal muscle activation. Pt noting some continued feeling of restriction in lateral ankle by end of session with STM revealing continued limitations in  scar mobility as well as some hypersensitivity at mid lateral incision, therefore provided instruction in desensitization techniques. CElenorafeels like she may be ready  to try transitioning to her HEP as of her final visit next week, but will make formal decision upon completion of goal assessment and MD PN for upcoming post-surgical f/u visit on 09/19/22.  OBJECTIVE IMPAIRMENTS: Abnormal gait, decreased activity tolerance, decreased balance, decreased coordination, decreased endurance, decreased knowledge of condition, decreased knowledge of use of DME, decreased mobility, difficulty walking, decreased ROM, decreased strength, decreased safety awareness, increased edema, increased fascial restrictions, impaired perceived functional ability, increased muscle spasms, impaired flexibility, impaired sensation, improper body mechanics, postural dysfunction, and pain.   ACTIVITY LIMITATIONS: standing, squatting, stairs, transfers, and locomotion level  PARTICIPATION LIMITATIONS: meal prep, cleaning, laundry, driving, shopping, community activity, occupation, and yard work  PERSONAL FACTORS: Past/current experiences, Profession, Time since onset of injury/illness/exacerbation, and 3+ comorbidities: HTN, migraines, GERD, LBP  are also affecting patient's functional outcome.   REHAB POTENTIAL: Good  CLINICAL DECISION MAKING: Stable/uncomplicated  EVALUATION COMPLEXITY: Low   GOALS: Goals reviewed with patient? Yes  SHORT TERM GOALS: Target date: 08/20/2022   Patient will be independent with initial HEP. Baseline: Initial HEP provided on eval Goal status: MET - 08/06/22  2.  Patient will demonstrate improved L ankle DF AROM to >/= neutral to improve gait mechanics. Baseline: Refer to above AROM chart Goal status: MET - 08/20/22  3.  Patient will ambulate with LRAD demonstrating step-through pattern and improved gait mechanics including normal heel-toe progression on L w/o increased L ankle  pain. Baseline: Antalgic gait with decreased weight shift to L, decreased stance time on L and decreased R step length Goal status: MET - 08/20/22 - Pt able to demonstrate good heel toe progression on L with more symmetrical step pattern using SPC, but not quite as fluid pattern w/o AD   LONG TERM GOALS: Target date: 09/17/2022   Patient will be independent with advanced/ongoing HEP to improve outcomes and carryover.  Baseline: Initial HEP provided on eval Goal status: IN PROGRESS  2.  Patient will report at least 75% improvement in L ankle pain to improve QOL. Baseline: 9/10 Goal status: IN PROGRESS  3.  Patient will demonstrate improved L ankle AROM to [facility] with at least 10 DF to allow for normal gait and stair mechanics. Baseline: Refer to above AROM chart - AROM DF lacking 5 from neutral Goal status: MET - 08/31/22 - DF AROM to 11  4.  Patient will demonstrate improved B LE strength to >/= 4+/5 with L ankle PF at least 3/5 for improved stability and ease of mobility . Baseline: Refer to above MMT chart Goal status: IN PROGRESS - 08/30/22 - Overall MMT improved by at least 1 MMT grade   5.  Patient will be able to ambulate 600' with LRAD and normal gait pattern without increased foot/ankle pain to access community.  Baseline: Antalgic gait with decreased weight shift to L, decreased stance time on L and decreased R step length Goal status: IN PROGRESS  6. Patient will be able to ascend/descend stairs with 1 HR and reciprocal step pattern safely to access home and community.  Baseline: NT on eval Goal status: IN PROGRESS - 09/05/22 - still having trouble with going downstairs d/t mobility in L ankle  7.  Patient will report at least 9 point improvement on LEFS to demonstrate improved functional ability. Baseline: 32 / 80 = 40.0 % Goal status: IN PROGRESS  8.  Patient will demonstrate at least 19/24 on DGI to decrease risk of falls. Baseline: NT on eval Goal status: IN PROGRESS  PLAN: PT FREQUENCY: 2x/week  PT DURATION: 8 weeks  PLANNED INTERVENTIONS: Therapeutic exercises, Therapeutic activity, Neuromuscular re-education, Balance training, Gait training, Patient/Family education, Self Care, Joint mobilization, Stair training, DME instructions, Dry Needling, Electrical stimulation, Cryotherapy, Moist heat, Taping, Vasopneumatic device, Ultrasound, Ionotophoresis 47m/ml Dexamethasone, Manual therapy, and Re-evaluation  PLAN FOR NEXT SESSION: MD PN for 93/61/22+ recert vs transition to HEP; progress WB and weight shifting; gently progress L ankle ROM and strengthening - update HEP; gait training to reinforce weight shift to L with good heel-toe progression    JPercival Spanish PT 09/13/2022, 3:56 PM  PHYSICAL THERAPY DISCHARGE SUMMARY  Visits from Start of Care: 12  Current functional level related to goals / functional outcomes:   Refer to above clinical impression and goal assessment for status as of last visit on 09/13/22. Patient cancelled her final visit and has not returned in >30 days, therefore will proceed with discharge from PT for this episode.    Remaining deficits:   As above. Unable to formally assess status at discharge due to failure to return for final visit.   Education / Equipment:   HEP, incisional scar mobilization/massage   Patient agrees to discharge. Patient goals were partially met. Patient is being discharged due to not returning since the last visit.  JPercival Spanish PT, MPT 11/20/22, 8:52 AM  [facility]2Mary EstherRBushnellHMount [place] [address]Phone: [phone]  Fax:  [phone]

## 2022-09-17 ENCOUNTER — Ambulatory Visit: Payer: Medicaid Other | Admitting: Physical Therapy

## 2022-09-19 ENCOUNTER — Encounter: Payer: Self-pay | Admitting: Orthopaedic Surgery

## 2022-09-19 ENCOUNTER — Ambulatory Visit (INDEPENDENT_AMBULATORY_CARE_PROVIDER_SITE_OTHER): Payer: Medicaid Other | Admitting: Orthopaedic Surgery

## 2022-09-19 DIAGNOSIS — S8262XD Displaced fracture of lateral malleolus of left fibula, subsequent encounter for closed fracture with routine healing: Secondary | ICD-10-CM

## 2022-09-19 DIAGNOSIS — Z9889 Other specified postprocedural states: Secondary | ICD-10-CM | POA: Diagnosis not present

## 2022-09-19 DIAGNOSIS — Z8781 Personal history of (healed) traumatic fracture: Secondary | ICD-10-CM

## 2022-09-19 MED ORDER — CELECOXIB 200 MG PO CAPS: 200.0000 mg | ORAL_CAPSULE | Freq: Two times a day (BID) | ORAL | 1 refills | Status: AC | PRN

## 2022-09-19 MED ORDER — PREDNISONE 50 MG PO TABS
ORAL_TABLET | ORAL | 0 refills | Status: AC
Start: 2022-09-19 — End: ?

## 2022-09-19 MED ORDER — HYDROCODONE-ACETAMINOPHEN 5-325 MG PO TABS: 1.0000 | ORAL_TABLET | Freq: Three times a day (TID) | ORAL | 0 refills | Status: DC | PRN

## 2022-09-19 NOTE — Progress Notes (Signed)
The patient is almost 4 months status post open reduction/internal fixation of an unstable left ankle lateral malleolus fracture.  She has now been through physical therapy helping with get her motion back for the ankle.  He has been significantly painful last few days with weather changes.  She does wear a lace up ankle brace and is in water aerobics working on strengthening and weight loss as well.  She still walks with a slight limp.  Her ankle range of motion is improved significantly and she is having some pain appropriately with me stressing the ankle and with range of motion.  Her incision has healed nicely.  We did not x-ray the ankle today.  Given her increasing pain I will try just a short course of pain medications as well as some prednisone and an anti-inflammatory.  I have recommended an ankle sleeve to try from [employer].  She can get this online.  The next time I would like to see her back is in 6 months with a repeat 3 views of her left ankle.

## 2022-10-08 ENCOUNTER — Other Ambulatory Visit: Payer: Self-pay | Admitting: Orthopaedic Surgery

## 2022-10-25 ENCOUNTER — Telehealth: Payer: Self-pay | Admitting: Orthopaedic Surgery

## 2022-10-25 NOTE — Telephone Encounter (Signed)
Please advise 

## 2022-10-25 NOTE — Telephone Encounter (Signed)
Patient called needing Rx refilled  Hydrocodone. The number to contact patient is [phone]

## 2022-10-25 NOTE — Telephone Encounter (Signed)
I called and advised pt. She stated she is having a lot of pain still that tylenol doesn't cover. I advised her that if she is still having pain we should probably see her in the office again. She was ok with this. Scheduled her to come in on Monday to see gil.

## 2022-10-29 ENCOUNTER — Ambulatory Visit: Payer: Medicaid Other | Admitting: Physician Assistant

## 2023-03-20 ENCOUNTER — Encounter: Payer: Self-pay | Admitting: Orthopaedic Surgery

## 2023-03-20 ENCOUNTER — Ambulatory Visit (INDEPENDENT_AMBULATORY_CARE_PROVIDER_SITE_OTHER): Payer: 59 | Admitting: Orthopaedic Surgery

## 2023-03-20 ENCOUNTER — Other Ambulatory Visit (INDEPENDENT_AMBULATORY_CARE_PROVIDER_SITE_OTHER): Payer: 59

## 2023-03-20 DIAGNOSIS — Z9889 Other specified postprocedural states: Secondary | ICD-10-CM

## 2023-03-20 DIAGNOSIS — Z8781 Personal history of (healed) traumatic fracture: Secondary | ICD-10-CM | POA: Diagnosis not present

## 2023-03-20 NOTE — Progress Notes (Signed)
The patient has a history of a left ankle lateral malleolus fracture and she underwent open reduction/internal fixation of this injury back in June of last year.  This was mainly due to widening of the ankle mortise.  She says that her ankle does lock up on her own times and weather changes cause pain but otherwise she is doing well.  She does have some medial joint line tenderness but there is no instability of her left ankle on exam.  It feels ligamentously sound and stable.  Her range of motion is full.  Her incisions healed nicely.  3 views left ankle shows that the lateral malleolus fracture healed completely and the hardware is intact.  The ankle mortise is congruent.  There is slight bone spurring around the medial malleolus suggesting some arthritic findings.  The joint space again is congruent.  I would like to send her to outpatient physical therapy for any modalities that can work on strengthening her ankles especially left side.  They can also work on range of motion but is mainly strengthening and any other modalities that can help decrease her ankle pain.  I have recommended her considering an ankle sleeve for some compression.  I have also recommended topical Voltaren gel to try on the medial aspect of her ankle or where she is tender to 3 times a day.  We will set her up for outpatient physical therapy and I would likely then see her back in about 6 weeks.

## 2023-03-21 ENCOUNTER — Other Ambulatory Visit: Payer: Self-pay

## 2023-03-21 DIAGNOSIS — Z9889 Other specified postprocedural states: Secondary | ICD-10-CM

## 2023-04-02 ENCOUNTER — Ambulatory Visit: Payer: 59 | Attending: Orthopaedic Surgery

## 2023-04-09 ENCOUNTER — Ambulatory Visit: Payer: 59

## 2023-04-15 IMAGING — RF DG ANKLE COMPLETE 3+V*L*
1 series · 3 of 3 positions shown · non-contrast
Comparison: Left ankle radiographs 05/20/2022

CLINICAL DATA: Intraoperative fluoroscopy for left ankle ORIF

EXAM:
LEFT ANKLE COMPLETE - 3+ VIEW

[Series 1: unknown protocol · 0.14mm/px · 3 of 3 slices shown]
[im 1/3]
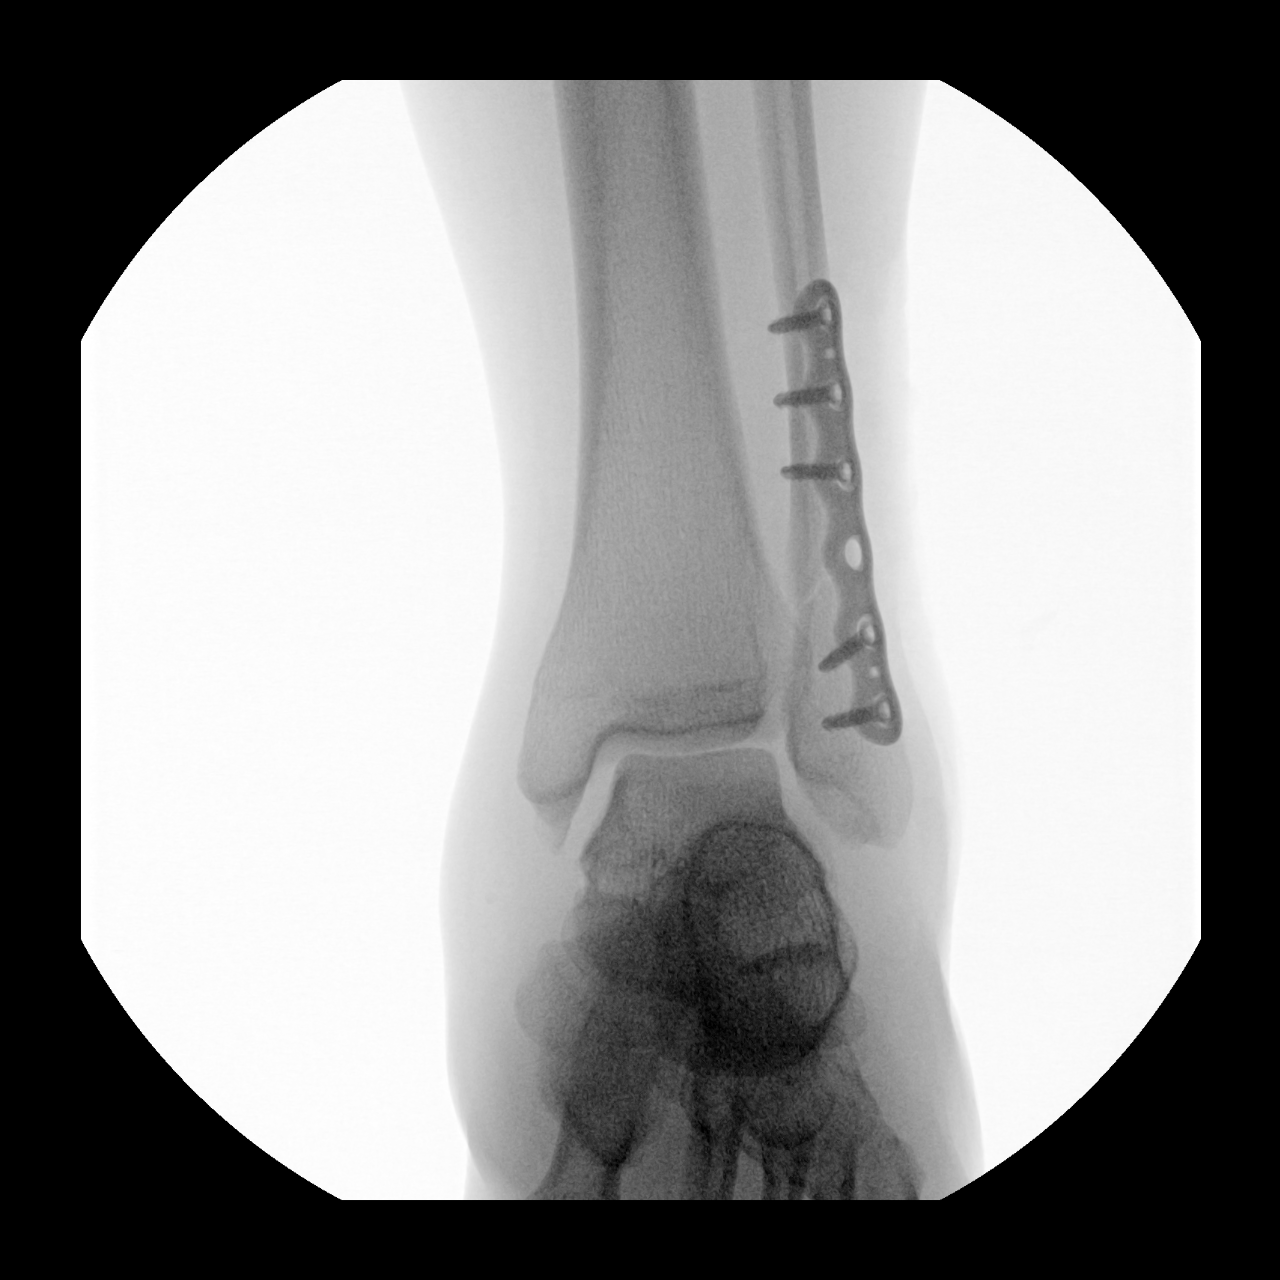
[im 2/3]
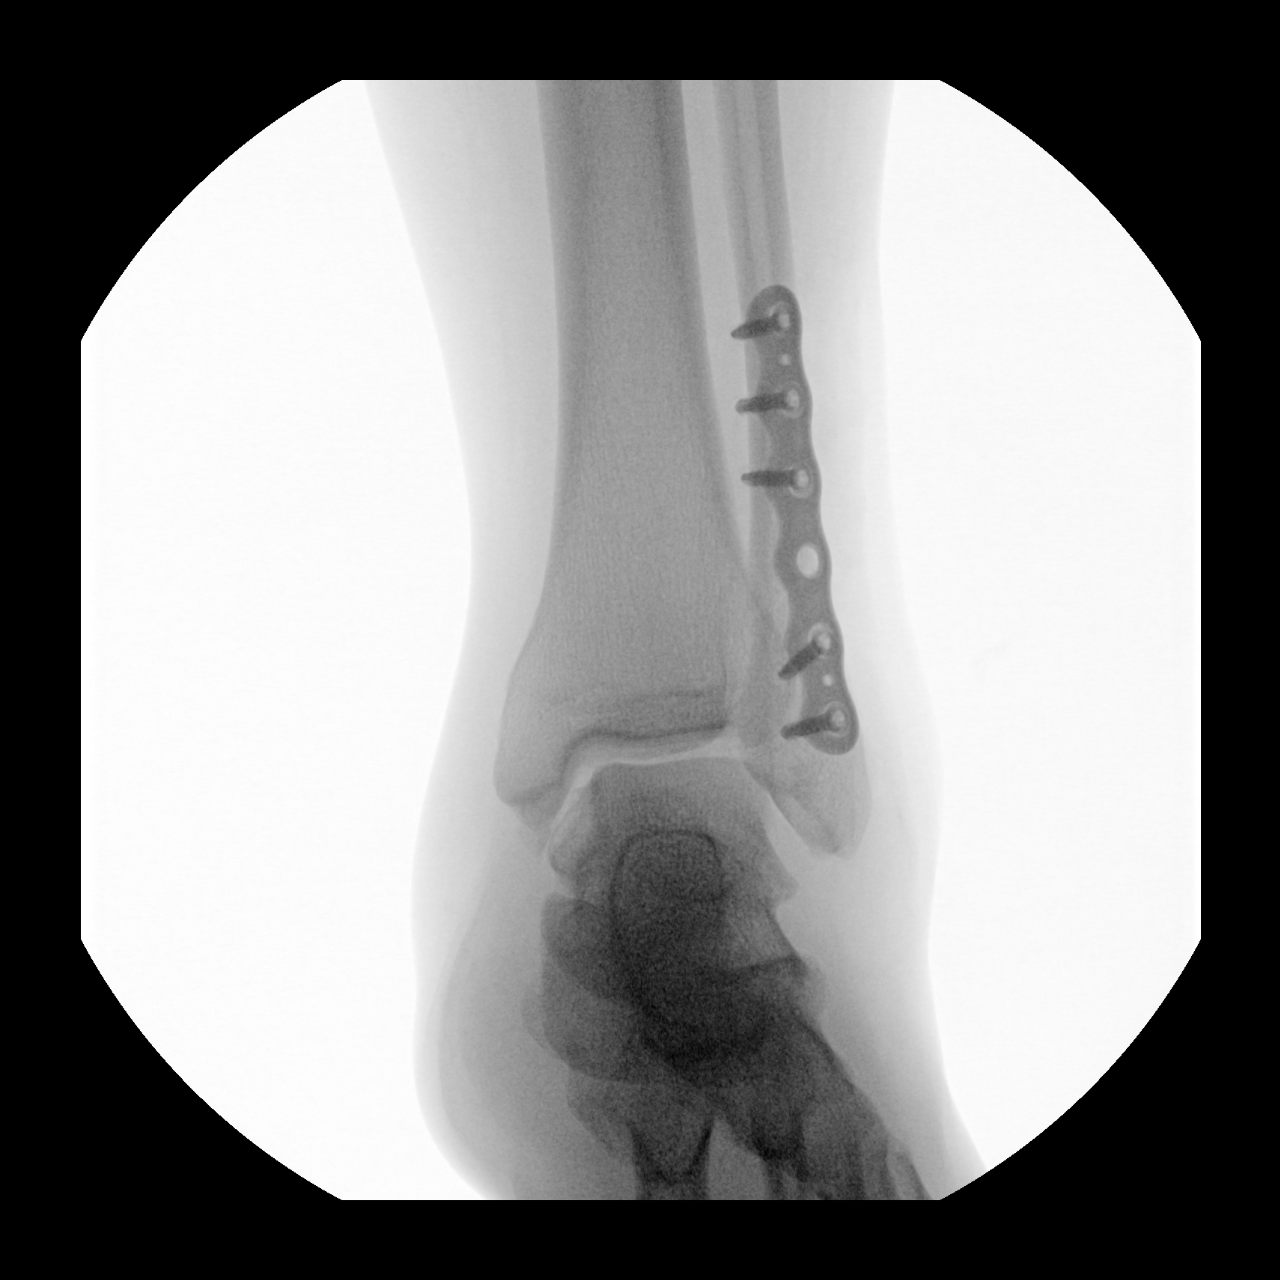
[im 3/3]
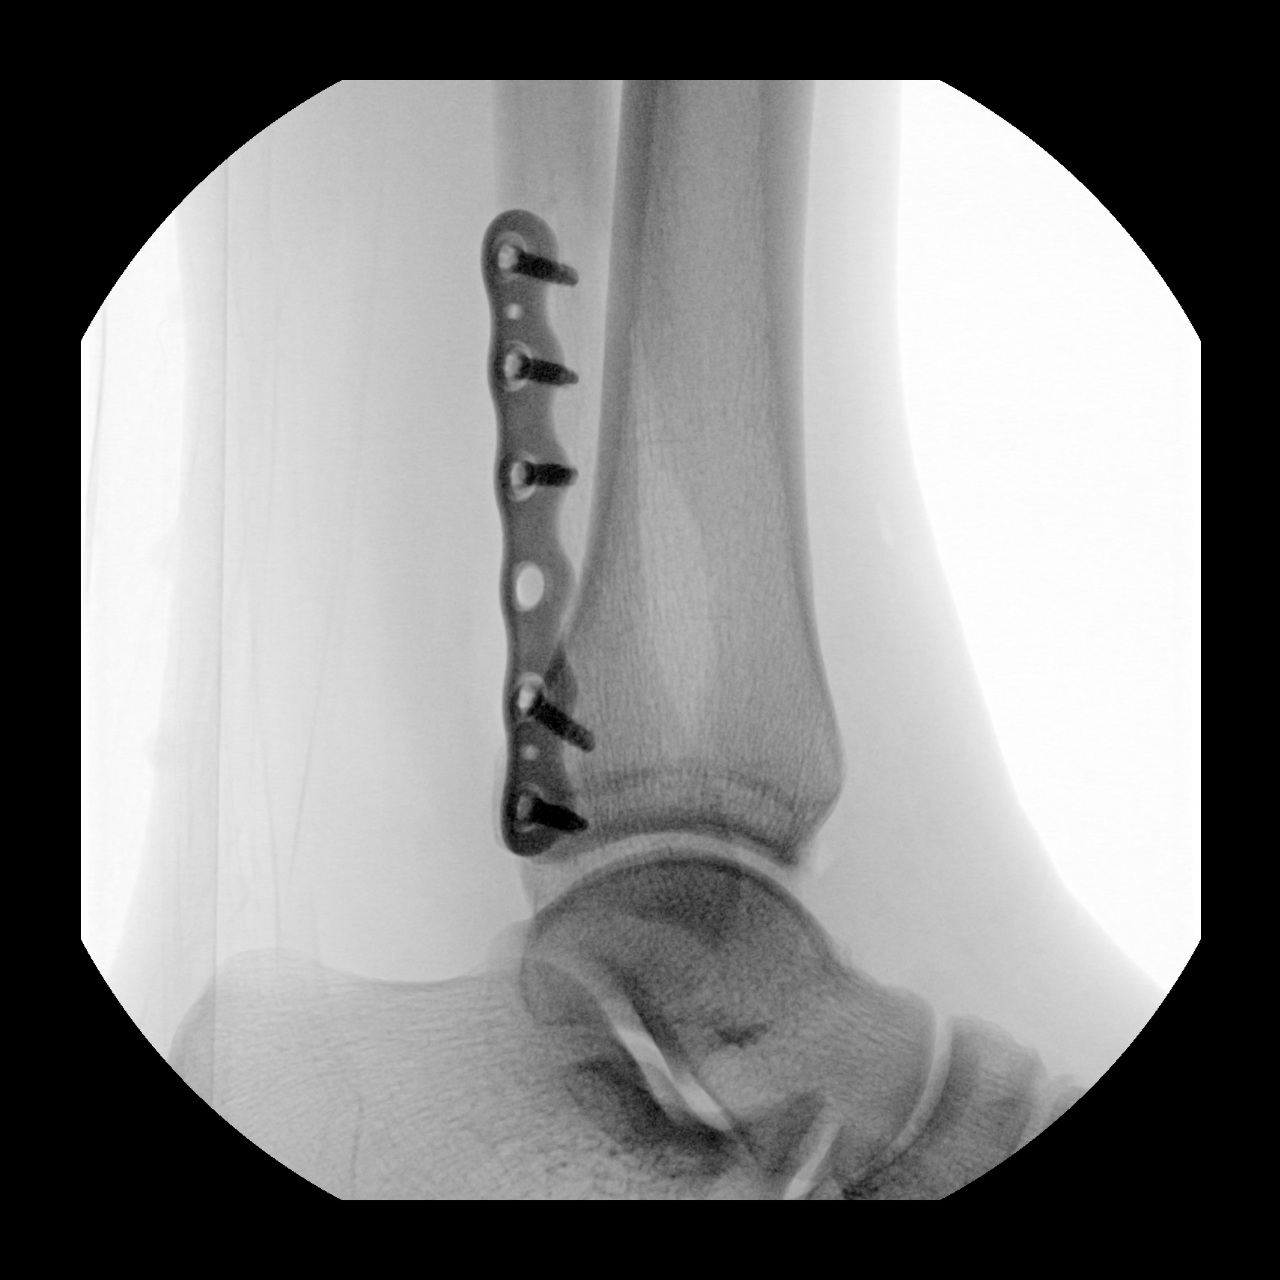

[3 of 3 positions shown; findings below may reference images not displayed]

FINDINGS: Images were performed intraoperatively without the presence of a
radiologist. The patient is undergoing lateral plate and screw
fixation of the distal fibula diaphyseal oblique fracture. Improved,
now normal alignment.

Total fluoroscopy images: 3

Total fluoroscopy time: 18 seconds

Total dose: Radiation Exposure Index (as provided by the
fluoroscopic device): 0.577 mGy air Kerma

Please see intraoperative findings for further detail.
IMPRESSION: Intraoperative fluoroscopy for left fibular ORIF.

## 2023-04-18 ENCOUNTER — Encounter: Payer: Medicaid Other | Admitting: Physical Therapy

## 2023-05-01 ENCOUNTER — Encounter: Payer: Self-pay | Admitting: Orthopaedic Surgery

## 2023-05-01 ENCOUNTER — Ambulatory Visit (INDEPENDENT_AMBULATORY_CARE_PROVIDER_SITE_OTHER): Payer: 59 | Admitting: Orthopaedic Surgery

## 2023-05-01 DIAGNOSIS — Z8781 Personal history of (healed) traumatic fracture: Secondary | ICD-10-CM | POA: Diagnosis not present

## 2023-05-01 DIAGNOSIS — Z9889 Other specified postprocedural states: Secondary | ICD-10-CM

## 2023-05-01 NOTE — Progress Notes (Signed)
The patient is a 47 year old who is getting close to a year out from open reduction/intra fixation of the left ankle lateral malleolus fracture that was unstable.  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  She is doing great overall.  She says it does still swell at times and is stiff in the morning.  On exam her left ankle shows full range of motion.  She has excellent strength with the ankle as well.  There is no instability ligamentously of the ankle on my exam.  She understands that swelling may still take some to subside but she is otherwise doing great and has no issues at all.  If she does develop issues she will let us know.  Follow-up is as needed.

## 2023-05-07 ENCOUNTER — Ambulatory Visit: Payer: 59 | Attending: Orthopaedic Surgery | Admitting: Physical Therapy

## 2023-05-07 DIAGNOSIS — R2681 Unsteadiness on feet: Secondary | ICD-10-CM | POA: Insufficient documentation

## 2023-05-07 DIAGNOSIS — R2689 Other abnormalities of gait and mobility: Secondary | ICD-10-CM | POA: Insufficient documentation

## 2023-05-07 DIAGNOSIS — M25572 Pain in left ankle and joints of left foot: Secondary | ICD-10-CM | POA: Insufficient documentation

## 2023-05-07 DIAGNOSIS — M25672 Stiffness of left ankle, not elsewhere classified: Secondary | ICD-10-CM | POA: Insufficient documentation

## 2023-05-15 ENCOUNTER — Ambulatory Visit: Payer: 59 | Admitting: Physical Therapy

## 2023-05-15 ENCOUNTER — Encounter: Payer: Self-pay | Admitting: Physical Therapy

## 2023-05-15 ENCOUNTER — Other Ambulatory Visit: Payer: Self-pay

## 2023-05-15 DIAGNOSIS — M25672 Stiffness of left ankle, not elsewhere classified: Secondary | ICD-10-CM

## 2023-05-15 DIAGNOSIS — R2681 Unsteadiness on feet: Secondary | ICD-10-CM | POA: Diagnosis present

## 2023-05-15 DIAGNOSIS — R2689 Other abnormalities of gait and mobility: Secondary | ICD-10-CM | POA: Diagnosis present

## 2023-05-15 DIAGNOSIS — M25572 Pain in left ankle and joints of left foot: Secondary | ICD-10-CM

## 2023-05-15 NOTE — Therapy (Signed)
OUTPATIENT PHYSICAL THERAPY LOWER EXTREMITY EVALUATION   Patient Name: Jasmine Mcdaniel MRN: [id number] DOB:1976/01/01, 47 y.o., female Today's Date: 05/15/2023   END OF SESSION:  PT End of Session - 05/15/23 1110     Visit Number 1    Date for PT Re-Evaluation 06/26/23    Authorization Type [employer]    PT Start Time 1110    PT Stop Time 1157    PT Time Calculation (min) 47 min    Activity Tolerance Patient tolerated treatment well    Behavior During Therapy WFL for tasks assessed/performed             Past Medical History:  Diagnosis Date   GERD (gastroesophageal reflux disease)    History of migraine headaches    Hypertension    Past Surgical History:  Procedure Laterality Date   LEG SURGERY Left    ORIF ANKLE FRACTURE Left 05/25/2022   Procedure: OPEN REDUCTION INTERNAL FIXATION (ORIF) LEFT LATERAL MALLEOLUS ANKLE FRACTURE;  Surgeon: Kathryne Hitch, MD;  Location: [facility];  Service: Orthopedics;  Laterality: Left;   WISDOM TOOTH EXTRACTION     Patient Active Problem List   Diagnosis Date Noted   Closed left ankle fracture 05/25/2022   Closed displaced fracture of lateral malleolus of left fibula 05/23/2022    PCP: Daylene Katayama, PA   REFERRING PROVIDER: Kathryne Hitch, MD  REFERRING DIAG: Z98.890,Z87.81 (ICD-10-CM) - S/P ORIF (open reduction internal fixation) fracture   Eval and Treat Left ankle strengthening   THERAPY DIAG:  Stiffness of left ankle, not elsewhere classified  Pain in left ankle and joints of left foot  Other abnormalities of gait and mobility  Unsteadiness on feet  RATIONALE FOR EVALUATION AND TREATMENT: Rehabilitation  ONSET DATE: 05/25/22  NEXT MD VISIT: PRN - released by MD   SUBJECTIVE:                                                                                                                                                                                                         SUBJECTIVE  STATEMENT: Pt reports ongoing issues with her L ankle wanting to lock up on her - takes several minutes to get her ankle moving after periods of inactivity. Limited walking tolerance. Still having intermittent swelling. She has kept up with some of her prior HEP from 2023 ~1x/wk.  PAIN: Are you having pain? Yes: NPRS scale: 4/10 Pain location: L ankle Pain description: tingly, stiff Aggravating factors: periods of inactivity  Relieving factors: ankle rotation exercises  PERTINENT HISTORY:  05/25/2022 -  ORIF left lateral malleolus ankle fracture (05/20/22); HTN, migraines, GERD, LBP   PRECAUTIONS: None  WEIGHT BEARING RESTRICTIONS: No  FALLS:  Has patient fallen in last 6 months? 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  The ankle mortise is congruent.  There is slight spurring around the medial malleolus.   PATIENT SURVEYS:  LEFS 54 / 80 = 67.5 %  COGNITION: Overall cognitive status: Within functional limits for tasks assessed    SENSATION: WFL  EDEMA:  Intermittent edema  MUSCLE LENGTH: Hamstrings: very mild tightness B  ITB: mild/mod tight B Piriformis: mild/mod tight L>R Hip flexors: mild tight B Quads:  mild tight B Heelcord: mild tight on R, mod tight on L   POSTURE:  B pes planus  LOWER EXTREMITY ROM:  Active ROM Right eval Left eval  Ankle dorsiflexion 15 3  Ankle plantarflexion 45 46  Ankle inversion 36 24  Ankle eversion 25 18   Passive ROM Left eval  Ankle dorsiflexion 9  Ankle plantarflexion   Ankle inversion   Ankle eversion   (Blank rows = not tested)  LOWER EXTREMITY MMT:  MMT Right eval Left eval  Hip flexion 5 5  Hip extension 5 4+  Hip abduction 4+ 4+  Hip adduction 4+ 4+  Hip internal rotation 5 4+  Hip external rotation 4+ 4  Knee flexion 5 5  Knee extension 5 4+  Ankle dorsiflexion 5 4+  Ankle plantarflexion 5 4 (12 SLS HR)  Ankle inversion 5 4-  Ankle eversion 5 4-   (Blank rows = not tested)  LOWER EXTREMITY SPECIAL TESTS:  Hip special tests: Ober's test: positive  - bilateral  FUNCTIONAL TESTS:  SLS - R = 16.91 sec, L = 5.97 sec   TODAY'S TREATMENT:   05/15/23 Initial eval  THERAPEUTIC EXERCISE: to improve flexibility, strength and mobility.  Verbal and tactile cues throughout for technique.  Review of standing gastroc and soleus stretches   PATIENT EDUCATION:  Education details: PT eval findings, anticipated POC, HEP review, and HEP progression - GTB provided for ankle strengthening   Person educated: Patient Education method: Explanation, Demonstration, Verbal cues, and MedBridgeGO code texted to patient Education comprehension: verbalized understanding and needs further education  HOME EXERCISE PROGRAM: *Access Code: [email or web address] (code from prior episode for same diagnosis) URL: [email or web address] Date: 05/15/2023 Prepared by: Glenetta Hew  Exercises - Standing Gastroc Stretch at Counter  - 2 x daily - 7 x weekly - 2 sets - 30 sec hold - Standing Soleus Stretch at Counter  - 2 x daily - 7 x weekly - 3 reps - 30 sec hold  Patient Education - Scar Massage - Rubbing with Different Textures - Contrast  Bath  *MedBridgeGO code  texted to patient  ASSESSMENT:  CLINICAL IMPRESSION: Jasmine Mcdaniel is a 47 y.o. female who was seen today for physical therapy evaluation and treatment for ongoing L ankle pain and stiffness s/p ORIF left lateral malleolus ankle fracture on 05/25/22. She previously completed a PT episode from 07/23/22 to 09/13/22 for the same diagnosis. Current deficits include L ankle pain and intermittent edema, decreased L ankle ROM, decreased LE flexibility, decreased L>R LE strength, and limited activity tolerance with inability to run.  [place] will benefit from skilled PT to address above deficits to improve mobility and activity tolerance with decreased pain interference.   OBJECTIVE IMPAIRMENTS: decreased activity tolerance, difficulty walking, decreased ROM, decreased strength, hypomobility, increased edema, increased fascial restrictions, impaired perceived functional ability, increased muscle spasms, impaired flexibility, improper body mechanics, postural dysfunction, and pain.   ACTIVITY LIMITATIONS: standing, squatting, stairs, and locomotion level  PARTICIPATION LIMITATIONS: cleaning, shopping, community activity, occupation, and yard work  PERSONAL FACTORS: Past/current experiences, Time since onset of injury/illness/exacerbation, and 3+ comorbidities: 05/25/2022 - ORIF left lateral malleolus ankle fracture (05/20/22); HTN, migraines, GERD, LBP  are also affecting patient's functional outcome.   REHAB POTENTIAL: Good  CLINICAL DECISION MAKING: Stable/uncomplicated  EVALUATION COMPLEXITY: Low   GOALS: Goals reviewed with patient? Yes  SHORT TERM GOALS: Target date: 06/05/2023  Patient will be independent with initial HEP. Baseline:  Goal status: INITIAL  LONG TERM GOALS: Target date: 06/26/2023  Patient will be independent with advanced/ongoing HEP to improve outcomes and carryover.  Baseline:  Goal status: INITIAL  2.  Patient will report at least 50-75%  improvement in L ankle/foot pain to improve QOL. Baseline: 4/10 Goal status: INITIAL  3.  Patient will demonstrate improved L ankle DF AROM to [facility] to allow for normal gait and stair mechanics. Baseline: 3 Goal status: INITIAL  4.  Patient will demonstrate improved L LE strength to >/= 4+/5 for improved stability and ease of mobility. Baseline: Refer to above MMT table Goal status: INITIAL  5.  Patient will be able to increase walking tolerance to >/= 6-8K steps w/o limitation due to L ankle pain.  Baseline: 3.5K steps on average Goal status: INITIAL  6. Patient will be able to ascend/descend stairs with 1 HR and reciprocal step pattern safely to access home and community.  Baseline:  Goal status: INITIAL  7.  Patient will report >/= 63/80 on LEFS to demonstrate improved functional ability. Baseline: 54 / 80 = 67.5 % Goal status: INITIAL  8.  Patient will be able to maintain L SLS for >/= 15 sec for improved ankle stability. Baseline: 5.97 sec Goal status: INITIAL    PLAN:  PT FREQUENCY: 2x/week  PT DURATION: 4-6 weeks  PLANNED INTERVENTIONS: Therapeutic exercises, Therapeutic activity, Neuromuscular re-education, Balance training, Gait training, Patient/Family education, Self Care, Joint mobilization, Stair training, Aquatic Therapy, Dry Needling, Electrical stimulation, Cryotherapy, Moist heat, Taping, Vasopneumatic device, Ultrasound, Ionotophoresis 4mg /ml Dexamethasone, Manual therapy, and Re-evaluation  PLAN FOR NEXT SESSION: Review and update/modify HEP from prior PT episode; progress L ankle ROM and strengthening    Marry Guan, PT 05/15/2023, 4:01 PM

## 2023-05-21 ENCOUNTER — Encounter: Payer: Medicaid Other | Admitting: Physical Therapy

## 2023-05-22 ENCOUNTER — Ambulatory Visit: Payer: 59 | Admitting: Physical Therapy

## 2023-05-22 ENCOUNTER — Encounter: Payer: Self-pay | Admitting: Physical Therapy

## 2023-05-22 DIAGNOSIS — M25672 Stiffness of left ankle, not elsewhere classified: Secondary | ICD-10-CM | POA: Diagnosis not present

## 2023-05-22 DIAGNOSIS — R2681 Unsteadiness on feet: Secondary | ICD-10-CM

## 2023-05-22 DIAGNOSIS — M25572 Pain in left ankle and joints of left foot: Secondary | ICD-10-CM

## 2023-05-22 DIAGNOSIS — R2689 Other abnormalities of gait and mobility: Secondary | ICD-10-CM

## 2023-05-22 NOTE — Therapy (Signed)
OUTPATIENT PHYSICAL THERAPY TREATMENT   Patient Name: Jasmine Mcdaniel MRN: [id number] DOB:03-Dec-1976, 47 y.o., female Today's Date: 05/22/2023   END OF SESSION:  PT End of Session - 05/22/23 1146     Visit Number 2    Date for PT Re-Evaluation 06/26/23    Authorization Type [employer]    PT Start Time 1146    PT Stop Time 1228    PT Time Calculation (min) 42 min    Activity Tolerance Patient tolerated treatment well    Behavior During Therapy WFL for tasks assessed/performed             Past Medical History:  Diagnosis Date   GERD (gastroesophageal reflux disease)    History of migraine headaches    Hypertension    Past Surgical History:  Procedure Laterality Date   LEG SURGERY Left    ORIF ANKLE FRACTURE Left 05/25/2022   Procedure: OPEN REDUCTION INTERNAL FIXATION (ORIF) LEFT LATERAL MALLEOLUS ANKLE FRACTURE;  Surgeon: Kathryne Hitch, MD;  Location: [facility];  Service: Orthopedics;  Laterality: Left;   WISDOM TOOTH EXTRACTION     Patient Active Problem List   Diagnosis Date Noted   Closed left ankle fracture 05/25/2022   Closed displaced fracture of lateral malleolus of left fibula 05/23/2022    PCP: Daylene Katayama, PA   REFERRING PROVIDER: Kathryne Hitch, MD  REFERRING DIAG: Z98.890,Z87.81 (ICD-10-CM) - S/P ORIF (open reduction internal fixation) fracture   Eval and Treat Left ankle strengthening   THERAPY DIAG:  Stiffness of left ankle, not elsewhere classified  Pain in left ankle and joints of left foot  Other abnormalities of gait and mobility  Unsteadiness on feet  RATIONALE FOR EVALUATION AND TREATMENT: Rehabilitation  ONSET DATE: 05/25/22  NEXT MD VISIT: PRN - released by MD   SUBJECTIVE:                                                                                                                                                                                                         SUBJECTIVE STATEMENT: No  pain this morning. Just the usual stiffness.  PAIN: Are you having pain? No  PERTINENT HISTORY:  05/25/2022 - ORIF left lateral malleolus ankle fracture (05/20/22); HTN, migraines, GERD, LBP   PRECAUTIONS: None  WEIGHT BEARING RESTRICTIONS: No  FALLS:  Has patient fallen in last 6 months? 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  The ankle mortise is congruent.  There is slight spurring around the medial malleolus.   PATIENT SURVEYS:  LEFS 54 / 80 = 67.5 %  COGNITION: Overall cognitive status: Within functional limits for tasks assessed    SENSATION: WFL  EDEMA:  Intermittent edema  MUSCLE LENGTH: Hamstrings: very mild tightness B  ITB: mild/mod tight B Piriformis: mild/mod tight L>R Hip flexors: mild tight B Quads: mild tight B Heelcord: mild tight on R, mod tight on L   POSTURE:  B pes planus  LOWER EXTREMITY ROM:  Active ROM Right eval Left eval  Ankle dorsiflexion 15 3  Ankle plantarflexion 45 46  Ankle inversion 36 24  Ankle eversion 25 18   Passive ROM Left eval  Ankle dorsiflexion 9  Ankle plantarflexion   Ankle inversion   Ankle eversion   (Blank rows = not tested)  LOWER EXTREMITY  MMT:  MMT Right eval Left eval  Hip flexion 5 5  Hip extension 5 4+  Hip abduction 4+ 4+  Hip adduction 4+ 4+  Hip internal rotation 5 4+  Hip external rotation 4+ 4  Knee flexion 5 5  Knee extension 5 4+  Ankle dorsiflexion 5 4+  Ankle plantarflexion 5 4 (12 SLS HR)  Ankle inversion 5 4-  Ankle eversion 5 4-   (Blank rows = not tested)  LOWER EXTREMITY SPECIAL TESTS:  Hip special tests: Ober's test: positive  - bilateral  FUNCTIONAL TESTS:  SLS - R = 16.91 sec, L = 5.97 sec   TODAY'S TREATMENT:   05/22/23 THERAPEUTIC EXERCISE: to improve flexibility, strength and mobility.  Verbal and tactile cues throughout for technique.  Elliptical - L2.0 x 6 min Seated GTB L 4-way ankle x 10 each direction Standing GTB 4-way hip SLR x 10 each, 1 pole A for balance L SLS + R LE clocks x 5 B concentric/L eccentric heel raises x 10   05/15/23 Initial eval  THERAPEUTIC EXERCISE: to improve flexibility, strength and mobility.  Verbal and tactile cues throughout for technique.  Review of standing gastroc and soleus stretches   PATIENT EDUCATION:  Education details: HEP review, HEP update, and HEP progression  Person educated: Patient Education method: Explanation, Demonstration, Verbal cues, and MedBridgeGO updated Education comprehension: verbalized understanding and needs further education  HOME EXERCISE PROGRAM: *Access Code: [email or web address] URL: [email or web address] Date: 05/22/2023 Prepared by: Glenetta Hew  Exercises - Standing Gastroc Stretch at Counter  - 2 x daily - 7 x weekly - 2 sets - 30 sec hold - Standing Soleus Stretch at Counter  - 2 x daily - 7 x weekly - 3 reps - 30 sec hold - Supine Ankle Circles  - 2-3 x daily - 7 x weekly - 2 sets - 10 reps - Seated Ankle Plantar Flexion with Resistance Loop  - 1 x daily - 3-4 x weekly - 2  sets - 10 reps - 3 sec hold - Seated Ankle Dorsiflexion with Resistance  - 1 x daily - 3-4 x weekly - 2 sets - 10 reps - 3 sec  hold - Seated Ankle Eversion with Resistance  - 1 x daily - 3-4 x weekly - 2 sets - 10 reps - 3 sec hold - Seated Ankle Inversion with Resistance  - 1 x daily - 73-4 x weekly - 2 sets - 10 reps - 3 sec hold - [place]  - 1 x daily - 7 x weekly - 3 sets - 10 reps - Retro Step  - 1 x daily - 7 x weekly - 3 sets - 10 reps - Standing Hip Flexion with Anchored Resistance and Chair Support  - 1 x daily - 3-4 x weekly - 2 sets - 10 reps - 3 sec hold - Standing Hip Adduction with Anchored Resistance  - 1 x daily - 3-4 x weekly - 2 sets - 10 reps - 3 sec hold - Standing Hip Extension with Anchored Resistance  - 1 x daily - 3-4 x weekly - 2 sets - 10 reps - 3 sec hold - Standing Hip Abduction with Anchored Resistance  - 1 x daily - 3-4 x weekly - 2 sets - 10 reps - 3 sec hold - Single Leg Balance with Opposite Leg Star Reach  - 1 x daily - 7 x weekly - 2 sets - 10 reps - 3 sec hold - Standing Eccentric Heel Raise (Mirrored)  - 1 x daily - 3-4 x weekly - 2 sets - 10 reps - 3 sec hold  Patient Education - Scar Massage - Rubbing with Different Textures - Contrast Bath  *[email or web address] access code texted to patient   ASSESSMENT:  CLINICAL IMPRESSION: Rejeana feels like stretching and trying to get 3K steps a day is starting to help. HEP from prior PT episode reviewed, modified and updated to reflect exercises addressing current deficits.  Incorporated SLS balance with hip strengthening to improve both proximal and ankle stability.  Pt able to perform good return demonstration of all exercises, but fatigue noted with standing exercises.  Resistive strengthening exercises to be performed every other day at home, with stretches and balance activities to be performed daily, with patient verbalizing good understanding.   OBJECTIVE IMPAIRMENTS: decreased activity tolerance, difficulty walking, decreased ROM, decreased strength, hypomobility, increased edema, increased fascial restrictions, impaired perceived  functional ability, increased muscle spasms, impaired flexibility, improper body mechanics, postural dysfunction, and pain.   ACTIVITY LIMITATIONS: standing, squatting, stairs, and locomotion level  PARTICIPATION LIMITATIONS: cleaning, shopping, community activity, occupation, and yard work  PERSONAL FACTORS: Past/current experiences, Time since onset of injury/illness/exacerbation, and 3+ comorbidities: 05/25/2022 - ORIF left lateral malleolus ankle fracture (05/20/22); HTN, migraines, GERD, LBP  are also affecting patient's functional outcome.   REHAB POTENTIAL: Good  CLINICAL DECISION MAKING: Stable/uncomplicated  EVALUATION COMPLEXITY: Low   GOALS: Goals reviewed with patient? Yes  SHORT TERM GOALS: Target date: 06/05/2023  Patient will be independent with initial HEP. Baseline:  Goal status: IN PROGRESS  LONG TERM GOALS: Target date: 06/26/2023  Patient will be independent with advanced/ongoing HEP to improve outcomes and carryover.  Baseline:  Goal status: IN PROGRESS  2.  Patient will report at least 50-75% improvement in L ankle/foot pain to improve QOL. Baseline: 4/10 Goal status: IN PROGRESS  3.  Patient will demonstrate improved L ankle DF AROM to [facility] to allow for normal gait and stair mechanics. Baseline:  3 Goal status: IN PROGRESS  4.  Patient will demonstrate improved L LE strength to >/= 4+/5 for improved stability and ease of mobility. Baseline: Refer to above MMT table Goal status: IN PROGRESS  5.  Patient will be able to increase walking tolerance to >/= 6-8K steps w/o limitation due to L ankle pain.  Baseline: 3.5K steps on average Goal status: IN PROGRESS  6. Patient will be able to ascend/descend stairs with 1 HR and reciprocal step pattern safely to access home and community.  Baseline:  Goal status: IN PROGRESS  7.  Patient will report >/= 63/80 on LEFS to demonstrate improved functional ability. Baseline: 54 / 80 = 67.5 % Goal status: IN  PROGRESS  8.  Patient will be able to maintain L SLS for >/= 15 sec for improved ankle stability. Baseline: 5.97 sec Goal status: IN PROGRESS    PLAN:  PT FREQUENCY: 2x/week  PT DURATION: 4-6 weeks  PLANNED INTERVENTIONS: Therapeutic exercises, Therapeutic activity, Neuromuscular re-education, Balance training, Gait training, Patient/Family education, Self Care, Joint mobilization, Stair training, Aquatic Therapy, Dry Needling, Electrical stimulation, Cryotherapy, Moist heat, Taping, Vasopneumatic device, Ultrasound, Ionotophoresis 4mg /ml Dexamethasone, Manual therapy, and Re-evaluation  PLAN FOR NEXT SESSION:  progress L ankle ROM and strengthening; balance and proprioceptive training   Marry Guan, PT 05/22/2023, 12:41 PM

## 2023-05-23 ENCOUNTER — Ambulatory Visit: Payer: 59

## 2023-05-23 ENCOUNTER — Encounter: Payer: Medicaid Other | Admitting: Physical Therapy

## 2023-06-03 ENCOUNTER — Ambulatory Visit: Payer: 59 | Attending: Orthopaedic Surgery

## 2023-06-03 DIAGNOSIS — M25672 Stiffness of left ankle, not elsewhere classified: Secondary | ICD-10-CM | POA: Diagnosis present

## 2023-06-03 DIAGNOSIS — R2689 Other abnormalities of gait and mobility: Secondary | ICD-10-CM | POA: Diagnosis present

## 2023-06-03 DIAGNOSIS — R29898 Other symptoms and signs involving the musculoskeletal system: Secondary | ICD-10-CM | POA: Insufficient documentation

## 2023-06-03 DIAGNOSIS — R2681 Unsteadiness on feet: Secondary | ICD-10-CM | POA: Insufficient documentation

## 2023-06-03 DIAGNOSIS — R262 Difficulty in walking, not elsewhere classified: Secondary | ICD-10-CM | POA: Insufficient documentation

## 2023-06-03 DIAGNOSIS — M25572 Pain in left ankle and joints of left foot: Secondary | ICD-10-CM | POA: Diagnosis present

## 2023-06-03 NOTE — Therapy (Signed)
OUTPATIENT PHYSICAL THERAPY TREATMENT   Patient Name: KEILEIGH VAHEY MRN: [id number] DOB:21-Jan-1976, 47 y.o., female Today's Date: 06/03/2023   END OF SESSION:  PT End of Session - 06/03/23 1112     Visit Number 3    Date for PT Re-Evaluation 06/26/23    Authorization Type [employer]    PT Start Time 1108    PT Stop Time 1146    PT Time Calculation (min) 38 min    Activity Tolerance Patient tolerated treatment well    Behavior During Therapy WFL for tasks assessed/performed             Past Medical History:  Diagnosis Date   GERD (gastroesophageal reflux disease)    History of migraine headaches    Hypertension    Past Surgical History:  Procedure Laterality Date   LEG SURGERY Left    ORIF ANKLE FRACTURE Left 05/25/2022   Procedure: OPEN REDUCTION INTERNAL FIXATION (ORIF) LEFT LATERAL MALLEOLUS ANKLE FRACTURE;  Surgeon: Kathryne Hitch, MD;  Location: [facility];  Service: Orthopedics;  Laterality: Left;   WISDOM TOOTH EXTRACTION     Patient Active Problem List   Diagnosis Date Noted   Closed left ankle fracture 05/25/2022   Closed displaced fracture of lateral malleolus of left fibula 05/23/2022    PCP: Daylene Katayama, PA   REFERRING PROVIDER: Kathryne Hitch, MD  REFERRING DIAG: Z98.890,Z87.81 (ICD-10-CM) - S/P ORIF (open reduction internal fixation) fracture   Eval and Treat Left ankle strengthening   THERAPY DIAG:  Stiffness of left ankle, not elsewhere classified  Pain in left ankle and joints of left foot  Other abnormalities of gait and mobility  Unsteadiness on feet  RATIONALE FOR EVALUATION AND TREATMENT: Rehabilitation  ONSET DATE: 05/25/22  NEXT MD VISIT: PRN - released by MD   SUBJECTIVE:                                                                                                                                                                                                         SUBJECTIVE STATEMENT: Pt  notes the most pain when first getting up. She was able to run for a few steps last week. Just feels really stiff in her ankle.  PAIN: Are you having pain? No  PERTINENT HISTORY:  05/25/2022 - ORIF left lateral malleolus ankle fracture (05/20/22); HTN, migraines, GERD, LBP   PRECAUTIONS: None  WEIGHT BEARING RESTRICTIONS: No  FALLS:  Has patient fallen in last 6 months? 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  The ankle mortise is congruent.  There is slight spurring around the medial malleolus.   PATIENT SURVEYS:  LEFS 54 / 80 = 67.5 %  COGNITION: Overall cognitive status: Within functional limits for tasks assessed    SENSATION: WFL  EDEMA:  Intermittent edema  MUSCLE LENGTH: Hamstrings: very mild tightness B  ITB: mild/mod tight B Piriformis: mild/mod tight L>R Hip flexors: mild tight B Quads: mild tight B Heelcord: mild tight on R, mod tight on L   POSTURE:  B pes planus  LOWER EXTREMITY ROM:  Active ROM Right eval Left eval Left 06/03/23  Ankle dorsiflexion 15 3 4   Ankle plantarflexion 45 46 60  Ankle inversion 36 24 28  Ankle eversion 25 18 32   Passive ROM Left eval  Ankle dorsiflexion 9   Ankle plantarflexion   Ankle inversion   Ankle eversion   (Blank rows = not tested)  LOWER EXTREMITY MMT:  MMT Right eval Left eval  Hip flexion 5 5  Hip extension 5 4+  Hip abduction 4+ 4+  Hip adduction 4+ 4+  Hip internal rotation 5 4+  Hip external rotation 4+ 4  Knee flexion 5 5  Knee extension 5 4+  Ankle dorsiflexion 5 4+  Ankle plantarflexion 5 4 (12 SLS HR)  Ankle inversion 5 4-  Ankle eversion 5 4-   (Blank rows = not tested)  LOWER EXTREMITY SPECIAL TESTS:  Hip special tests: Ober's test: positive  - bilateral  FUNCTIONAL TESTS:  SLS - R = 16.91 sec, L = 5.97 sec   TODAY'S TREATMENT:  06/03/23 THERAPEUTIC EXERCISE: to improve flexibility, strength and mobility.  Verbal and tactile cues throughout for technique.  Nustep L5x79min Ankle ROM assessed L SLS x 30 sec; 15 second hold w/o UE support Clock balance with RLE + L SLS 5x 1/2 circle Marches from airex x 10 Bil  Manual Therapy: to decrease muscle spasm, pain and improve mobility STM to L peroneal muscles and tibialis anterior  05/22/23 THERAPEUTIC EXERCISE: to improve flexibility, strength and mobility.  Verbal and tactile cues throughout for technique.  Elliptical - L2.0 x 6 min Seated GTB L 4-way ankle x 10 each direction Standing GTB 4-way hip SLR x 10 each, 1 pole A for balance L SLS + R LE clocks x 5 B concentric/L eccentric heel raises x 10   05/15/23 Initial eval  THERAPEUTIC EXERCISE: to improve flexibility, strength and mobility.  Verbal and tactile cues throughout for technique.  Review of standing gastroc and soleus stretches   PATIENT EDUCATION:  Education details: HEP review, HEP update, and HEP progression  Person educated: Patient Education method: Explanation, Demonstration, Verbal cues, and MedBridgeGO updated Education comprehension: verbalized understanding and needs further education  HOME EXERCISE PROGRAM: *Access  Code: [email or web address] URL:  [email or web address] Date: 05/22/2023 Prepared by: Glenetta Hew  Exercises - Standing Gastroc Stretch at Counter  - 2 x daily - 7 x weekly - 2 sets - 30 sec hold - Standing Soleus Stretch at Counter  - 2 x daily - 7 x weekly - 3 reps - 30 sec hold - Supine Ankle Circles  - 2-3 x daily - 7 x weekly - 2 sets - 10 reps - Seated Ankle Plantar Flexion with Resistance Loop  - 1 x daily - 3-4 x weekly - 2 sets - 10 reps - 3 sec hold - Seated Ankle Dorsiflexion with Resistance  - 1 x daily - 3-4 x weekly - 2 sets - 10 reps - 3 sec hold - Seated Ankle Eversion with Resistance  - 1 x daily - 3-4 x weekly - 2 sets - 10 reps - 3 sec hold - Seated Ankle Inversion with Resistance  - 1 x daily - 73-4 x weekly - 2 sets - 10 reps - 3 sec hold - [place] Pew  - 1 x daily - 7 x weekly - 3 sets - 10 reps - Retro Step  - 1 x daily - 7 x weekly - 3 sets - 10 reps - Standing Hip Flexion with Anchored Resistance and Chair Support  - 1 x daily - 3-4 x weekly - 2 sets - 10 reps - 3 sec hold - Standing Hip Adduction with Anchored Resistance  - 1 x daily - 3-4 x weekly - 2 sets - 10 reps - 3 sec hold - Standing Hip Extension with Anchored Resistance  - 1 x daily - 3-4 x weekly - 2 sets - 10 reps - 3 sec hold - Standing Hip Abduction with Anchored Resistance  - 1 x daily - 3-4 x weekly - 2 sets - 10 reps - 3 sec hold - Single Leg Balance with Opposite Leg Star Reach  - 1 x daily - 7 x weekly - 2 sets - 10 reps - 3 sec hold - Standing Eccentric Heel Raise (Mirrored)  - 1 x daily - 3-4 x weekly - 2 sets - 10 reps - 3 sec hold  Patient Education - Scar Massage - Rubbing with Different Textures - Contrast Bath  *[email or web address] access code texted to patient   ASSESSMENT:  CLINICAL IMPRESSION: Pt arrived 8 min late to visit today. Ankle AROM was assessed showing improvement in all movements, ankle DF is most limited. LOB with L SLS after 15 seconds w/o UE support. She is very tight in the peroneals as found  during MT, would benefit from some more manual or even DN to address the muscle tension.  Provided blue TB for 4 way ankle exercises on HEP.   OBJECTIVE IMPAIRMENTS: decreased activity tolerance, difficulty walking, decreased ROM, decreased strength, hypomobility, increased edema, increased fascial restrictions, impaired perceived functional ability, increased muscle spasms, impaired flexibility, improper body mechanics, postural dysfunction, and pain.   ACTIVITY LIMITATIONS: standing, squatting, stairs, and locomotion level  PARTICIPATION LIMITATIONS: cleaning, shopping, community activity, occupation, and yard work  PERSONAL FACTORS: Past/current experiences, Time since onset of injury/illness/exacerbation, and 3+ comorbidities: 05/25/2022 - ORIF left lateral malleolus ankle fracture (05/20/22); HTN, migraines, GERD, LBP  are also affecting patient's functional outcome.   REHAB POTENTIAL: Good  CLINICAL DECISION MAKING: Stable/uncomplicated  EVALUATION COMPLEXITY: Low   GOALS: Goals reviewed with patient? Yes  SHORT TERM GOALS: Target date: 06/05/2023  Patient will be independent with initial HEP. Baseline:  Goal status:  MET- 06/03/23  LONG TERM GOALS: Target date: 06/26/2023  Patient will be independent with advanced/ongoing HEP to improve outcomes and carryover.  Baseline:  Goal status: IN PROGRESS  2.  Patient will report at least 50-75% improvement in L ankle/foot pain to improve QOL. Baseline: 4/10 Goal status: IN PROGRESS  3.  Patient will demonstrate improved L ankle DF AROM to [facility] to allow for normal gait and stair mechanics. Baseline: 3 Goal status: IN PROGRESS  4.  Patient will demonstrate improved L LE strength to >/= 4+/5 for improved stability and ease of mobility. Baseline: Refer to above MMT table Goal status: IN PROGRESS  5.  Patient will be able to increase walking tolerance to >/= 6-8K steps w/o limitation due to L ankle pain.  Baseline: 3.5K steps on  average Goal status: IN PROGRESS  6. Patient will be able to ascend/descend stairs with 1 HR and reciprocal step pattern safely to access home and community.  Baseline:  Goal status: IN PROGRESS  7.  Patient will report >/= 63/80 on LEFS to demonstrate improved functional ability. Baseline: 54 / 80 = 67.5 % Goal status: IN PROGRESS  8.  Patient will be able to maintain L SLS for >/= 15 sec for improved ankle stability. Baseline: 5.97 sec Goal status: IN PROGRESS    PLAN:  PT FREQUENCY: 2x/week  PT DURATION: 4-6 weeks  PLANNED INTERVENTIONS: Therapeutic exercises, Therapeutic activity, Neuromuscular re-education, Balance training, Gait training, Patient/Family education, Self Care, Joint mobilization, Stair training, Aquatic Therapy, Dry Needling, Electrical stimulation, Cryotherapy, Moist heat, Taping, Vasopneumatic device, Ultrasound, Ionotophoresis 4mg /ml Dexamethasone, Manual therapy, and Re-evaluation  PLAN FOR NEXT SESSION:  progress L ankle ROM and strengthening; balance and proprioceptive training   Darleene Cleaver, PTA 06/03/2023, 12:04 PM

## 2023-06-06 ENCOUNTER — Ambulatory Visit: Payer: 59 | Admitting: Physical Therapy

## 2023-06-09 NOTE — Therapy (Signed)
OUTPATIENT PHYSICAL THERAPY TREATMENT   Patient Name: Jasmine Mcdaniel MRN: [id number] DOB:1976/07/23, 47 y.o., female Today's Date: 06/10/2023   END OF SESSION:  PT End of Session - 06/10/23 1300     Visit Number 4    Date for PT Re-Evaluation 06/26/23    Authorization Type [employer]    PT Start Time 1300    PT Stop Time 1346    PT Time Calculation (min) 46 min    Activity Tolerance Patient tolerated treatment well    Behavior During Therapy WFL for tasks assessed/performed             Past Medical History:  Diagnosis Date   GERD (gastroesophageal reflux disease)    History of migraine headaches    Hypertension    Past Surgical History:  Procedure Laterality Date   LEG SURGERY Left    ORIF ANKLE FRACTURE Left 05/25/2022   Procedure: OPEN REDUCTION INTERNAL FIXATION (ORIF) LEFT LATERAL MALLEOLUS ANKLE FRACTURE;  Surgeon: Kathryne Hitch, MD;  Location: [facility];  Service: Orthopedics;  Laterality: Left;   WISDOM TOOTH EXTRACTION     Patient Active Problem List   Diagnosis Date Noted   Closed left ankle fracture 05/25/2022   Closed displaced fracture of lateral malleolus of left fibula 05/23/2022    PCP: Daylene Katayama, PA   REFERRING PROVIDER: Kathryne Hitch, MD  REFERRING DIAG: [phone] (ICD-10-CM) - S/P ORIF (open reduction internal fixation) fracture   Eval and Treat Left ankle strengthening   THERAPY DIAG:  Stiffness of left ankle, not elsewhere classified  Pain in left ankle and joints of left foot  Other abnormalities of gait and mobility  Unsteadiness on feet  Difficulty in walking, not elsewhere classified  Other symptoms and signs involving the musculoskeletal system  RATIONALE FOR EVALUATION AND TREATMENT: Rehabilitation  ONSET DATE: 05/25/22  NEXT MD VISIT: PRN - released by MD   SUBJECTIVE:                                                                                                                                                                                                          SUBJECTIVE STATEMENT: Because it's cloudy, I'm feeling it.  PAIN: Are you having pain? Yes, 7/10  PERTINENT HISTORY:  05/25/2022 - ORIF left lateral malleolus ankle fracture (05/20/22); HTN, migraines, GERD, LBP   PRECAUTIONS: None  WEIGHT BEARING RESTRICTIONS: No  FALLS:  Has patient fallen in last 6 months? 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  The ankle mortise is congruent.  There is slight spurring around the medial malleolus.   PATIENT SURVEYS:  LEFS 54 / 80 = 67.5 %  COGNITION: Overall cognitive status: Within functional limits for tasks assessed    SENSATION: WFL  EDEMA:  Intermittent edema  MUSCLE LENGTH: Hamstrings: very mild tightness B  ITB: mild/mod tight B Piriformis: mild/mod tight L>R Hip flexors: mild tight B Quads: mild tight B Heelcord: mild tight on R, mod tight on L   POSTURE:  B pes planus  LOWER EXTREMITY ROM:  Active ROM Right eval Left eval Left 06/03/23  Ankle dorsiflexion 15 3 4   Ankle plantarflexion 45 46 60  Ankle inversion 36 24 28  Ankle eversion 25 18 32   Passive ROM  Left eval  Ankle dorsiflexion 9  Ankle plantarflexion   Ankle inversion   Ankle eversion   (Blank rows = not tested)  LOWER EXTREMITY MMT:  MMT Right eval Left eval  Hip flexion 5 5  Hip extension 5 4+  Hip abduction 4+ 4+  Hip adduction 4+ 4+  Hip internal rotation 5 4+  Hip external rotation 4+ 4  Knee flexion 5 5  Knee extension 5 4+  Ankle dorsiflexion 5 4+  Ankle plantarflexion 5 4 (12 SLS HR)  Ankle inversion 5 4-  Ankle eversion 5 4-   (Blank rows = not tested)  LOWER EXTREMITY SPECIAL TESTS:  Hip special tests: Ober's test: positive  - bilateral  FUNCTIONAL TESTS:  SLS - R = 16.91 sec, L = 5.97 sec   TODAY'S TREATMENT:  06/10/23 THERAPEUTIC EXERCISE: to improve flexibility, strength and mobility.  Verbal and tactile cues throughout for technique.  Bike L3 x 6 min L SLS x 30 sec; 15 second hold w/o UE support - reports feeling in medial foot on L Standing arch sets B 5 sec hold x 5 Clock balance with RLE + L SLS 5x 1/2 circle Marches (high knees) from airex x 10 Bil Tandem stance on airex B x 20 sec Tandem walking fwd/bwd x 10 ft ea Gastroc stretch L 2 x 20 sec   MANUAL THERAPY: to decrease muscle spasm and pain and improve mobility Deep STM and IASTM  to L peroneals and lateral gastroc in S/L and prone.   SELF CARE: educated on use of rolling pin or hand held roller for STM. Additionally educated pt on role of DN for future visit.    06/03/23 THERAPEUTIC EXERCISE: to improve flexibility, strength and mobility.  Verbal and tactile cues throughout for technique.  Nustep L5x68min Ankle ROM assessed L SLS x 30 sec; 15 second hold w/o UE support Clock balance with RLE + L SLS 5x 1/2 circle Marches from airex x 10 Bil  Manual Therapy: to decrease muscle spasm,  pain and improve mobility STM to L peroneal muscles and tibialis anterior  05/22/23 THERAPEUTIC EXERCISE: to improve flexibility, strength and mobility.  Verbal and tactile cues throughout for  technique.  Elliptical - L2.0 x 6 min Seated GTB L 4-way ankle x 10 each direction Standing GTB 4-way hip SLR x 10 each, 1 pole A for balance L SLS + R LE clocks x 5 B concentric/L eccentric heel raises x 10   05/15/23 Initial eval  THERAPEUTIC EXERCISE: to improve flexibility, strength and mobility.  Verbal and tactile cues throughout for technique.  Review of standing gastroc and soleus stretches   PATIENT EDUCATION:  Education details: HEP review, HEP update, and HEP progression  Person educated: Patient Education method: Explanation, Demonstration, Verbal cues, and MedBridgeGO updated Education comprehension: verbalized understanding and needs further education  HOME EXERCISE PROGRAM: *Access Code: [email or web address] URL: [email or web address] Date: 05/22/2023 Prepared by: Glenetta Hew  Exercises - Standing Gastroc Stretch at Counter  - 2 x daily - 7 x weekly - 2 sets - 30 sec hold - Standing Soleus Stretch at Counter  - 2 x daily - 7 x weekly - 3 reps - 30 sec hold - Supine Ankle Circles  - 2-3 x daily - 7 x weekly - 2 sets - 10 reps - Seated Ankle Plantar Flexion with Resistance Loop  - 1 x daily - 3-4 x weekly - 2 sets - 10 reps - 3 sec hold - Seated Ankle Dorsiflexion with Resistance  - 1 x daily - 3-4 x weekly - 2 sets - 10 reps - 3 sec hold - Seated Ankle Eversion with Resistance  - 1 x daily - 3-4 x weekly - 2 sets - 10 reps - 3 sec hold - Seated Ankle Inversion with Resistance  - 1 x daily - 73-4 x weekly - 2 sets - 10 reps - 3 sec hold - [place] Pew  - 1 x daily - 7 x weekly - 3 sets - 10 reps - Retro Step  - 1 x daily - 7 x weekly - 3 sets - 10 reps - Standing Hip Flexion with Anchored Resistance and Chair Support  - 1 x daily - 3-4 x weekly - 2 sets - 10 reps - 3 sec hold - Standing Hip Adduction with Anchored Resistance  - 1 x daily - 3-4 x weekly - 2 sets - 10 reps - 3 sec hold - Standing Hip Extension with Anchored Resistance  - 1 x daily - 3-4 x weekly - 2  sets - 10 reps - 3 sec hold - Standing Hip Abduction with Anchored Resistance  - 1 x daily - 3-4 x weekly - 2 sets - 10 reps - 3 sec hold - Single Leg Balance with Opposite Leg Star Reach  - 1 x daily - 7 x weekly - 2 sets - 10 reps - 3 sec hold - Standing Eccentric Heel Raise (Mirrored)  - 1 x daily - 3-4 x weekly - 2 sets - 10 reps - 3 sec hold  Patient Education - Scar Massage - Rubbing with Different Textures - Contrast Bath  *[email or web address] access code texted to patient   ASSESSMENT:  CLINICAL IMPRESSION: Elinda reporting increased pain in the left ankle today, attributing it to the weather. Good response to deep tissue work today reporting decreased tension afterwards. Patient advised to use deeper pressure at home at home with STM at least once per day. She was also educated on DN and would benefit from this at  a future visit.She wanted to investigate it a little more, so we deferred it today. Balance activities are still very challenging. Desirre continues to demonstrate potential for improvement and would benefit from continued skilled therapy to address impairments.    OBJECTIVE IMPAIRMENTS: decreased activity tolerance, difficulty walking, decreased ROM, decreased strength, hypomobility, increased edema, increased fascial restrictions, impaired perceived functional ability, increased muscle spasms, impaired flexibility, improper body mechanics, postural dysfunction, and pain.   ACTIVITY LIMITATIONS: standing, squatting, stairs, and locomotion level  PARTICIPATION LIMITATIONS: cleaning, shopping, community activity, occupation, and yard work  PERSONAL FACTORS: Past/current experiences, Time since onset of injury/illness/exacerbation, and 3+ comorbidities: 05/25/2022 - ORIF left lateral malleolus ankle fracture (05/20/22); HTN, migraines, GERD, LBP  are also affecting patient's functional outcome.   REHAB POTENTIAL: Good  CLINICAL DECISION MAKING:  Stable/uncomplicated  EVALUATION COMPLEXITY: Low   GOALS: Goals reviewed with patient? Yes  SHORT TERM GOALS: Target date: 06/05/2023  Patient will be independent with initial HEP. Baseline:  Goal status: MET- 06/03/23  LONG TERM GOALS: Target date: 06/26/2023  Patient will be independent with advanced/ongoing HEP to improve outcomes and carryover.  Baseline:  Goal status: IN PROGRESS  2.  Patient will report at least 50-75% improvement in L ankle/foot pain to improve QOL. Baseline: 4/10 Goal status: IN PROGRESS  3.  Patient will demonstrate improved L ankle DF AROM to [facility] to allow for normal gait and stair mechanics. Baseline: 3 Goal status: IN PROGRESS  4.  Patient will demonstrate improved L LE strength to >/= 4+/5 for improved stability and ease of mobility. Baseline: Refer to above MMT table Goal status: IN PROGRESS  5.  Patient will be able to increase walking tolerance to >/= 6-8K steps w/o limitation due to L ankle pain.  Baseline: 3.5K steps on average Goal status: IN PROGRESS  6. Patient will be able to ascend/descend stairs with 1 HR and reciprocal step pattern safely to access home and community.  Baseline:  Goal status: IN PROGRESS  7.  Patient will report >/= 63/80 on LEFS to demonstrate improved functional ability. Baseline: 54 / 80 = 67.5 % Goal status: IN PROGRESS  8.  Patient will be able to maintain L SLS for >/= 15 sec for improved ankle stability. Baseline: 5.97 sec Goal status: IN PROGRESS    PLAN:  PT FREQUENCY: 2x/week  PT DURATION: 4-6 weeks  PLANNED INTERVENTIONS: Therapeutic exercises, Therapeutic activity, Neuromuscular re-education, Balance training, Gait training, Patient/Family education, Self Care, Joint mobilization, Stair training, Aquatic Therapy, Dry Needling, Electrical stimulation, Cryotherapy, Moist heat, Taping, Vasopneumatic device, Ultrasound, Ionotophoresis 4mg /ml Dexamethasone, Manual therapy, and Re-evaluation  PLAN  FOR NEXT SESSION:  progress L ankle ROM and strengthening; balance and proprioceptive training   Shara Hartis, PT 06/10/2023, 2:58 PM

## 2023-06-10 ENCOUNTER — Ambulatory Visit: Payer: 59 | Admitting: Physical Therapy

## 2023-06-10 ENCOUNTER — Encounter: Payer: Self-pay | Admitting: Physical Therapy

## 2023-06-10 DIAGNOSIS — R29898 Other symptoms and signs involving the musculoskeletal system: Secondary | ICD-10-CM

## 2023-06-10 DIAGNOSIS — R262 Difficulty in walking, not elsewhere classified: Secondary | ICD-10-CM

## 2023-06-10 DIAGNOSIS — M25672 Stiffness of left ankle, not elsewhere classified: Secondary | ICD-10-CM | POA: Diagnosis not present

## 2023-06-10 DIAGNOSIS — R2689 Other abnormalities of gait and mobility: Secondary | ICD-10-CM

## 2023-06-10 DIAGNOSIS — M25572 Pain in left ankle and joints of left foot: Secondary | ICD-10-CM

## 2023-06-10 DIAGNOSIS — R2681 Unsteadiness on feet: Secondary | ICD-10-CM

## 2023-06-12 ENCOUNTER — Ambulatory Visit: Payer: 59 | Admitting: Physical Therapy

## 2023-06-18 NOTE — Therapy (Signed)
OUTPATIENT PHYSICAL THERAPY TREATMENT   Patient Name: GLEN JOW MRN: [id number] DOB:[birth year], 47 y.o., female Today's Date: 06/10/2023   END OF SESSION:  PT End of Session - 06/10/23 1300     Visit Number 4    Date for PT Re-Evaluation 06/26/23    Authorization Type [employer]    PT Start Time 1300    PT Stop Time 1346    PT Time Calculation (min) 46 min    Activity Tolerance Patient tolerated treatment well    Behavior During Therapy WFL for tasks assessed/performed             Past Medical History:  Diagnosis Date   GERD (gastroesophageal reflux disease)    History of migraine headaches    Hypertension    Past Surgical History:  Procedure Laterality Date   LEG SURGERY Left    ORIF ANKLE FRACTURE Left 05/25/2022   Procedure: OPEN REDUCTION INTERNAL FIXATION (ORIF) LEFT LATERAL MALLEOLUS ANKLE FRACTURE;  Surgeon: Kathryne Hitch, MD;  Location: [facility];  Service: Orthopedics;  Laterality: Left;   WISDOM TOOTH EXTRACTION     Patient Active Problem List   Diagnosis Date Noted   Closed left ankle fracture 05/25/2022   Closed displaced fracture of lateral malleolus of left fibula 05/23/2022    PCP: Daylene Katayama, PA   REFERRING PROVIDER: Kathryne Hitch, MD  REFERRING DIAG: [phone] (ICD-10-CM) - S/P ORIF (open reduction internal fixation) fracture   Eval and Treat Left ankle strengthening   THERAPY DIAG:  Stiffness of left ankle, not elsewhere classified  Pain in left ankle and joints of left foot  Other abnormalities of gait and mobility  Unsteadiness on feet  Difficulty in walking, not elsewhere classified  Other symptoms and signs involving the musculoskeletal system  RATIONALE FOR EVALUATION AND TREATMENT: Rehabilitation  ONSET DATE: 05/25/22  NEXT MD VISIT: PRN - released by MD   SUBJECTIVE:                                                                                                                                                                                                          SUBJECTIVE STATEMENT: ***.  PAIN: Are you having pain? Yes, 7/10  PERTINENT HISTORY:  05/25/2022 - ORIF left lateral malleolus ankle fracture (05/20/22); HTN, migraines, GERD, LBP   PRECAUTIONS: None  WEIGHT BEARING RESTRICTIONS: No  FALLS:  Has patient fallen in last 6 months? 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  The ankle mortise is congruent.  There is slight spurring around the medial malleolus.   PATIENT SURVEYS:  LEFS 54 / 80 = 67.5 %  COGNITION: Overall cognitive status: Within functional limits for tasks assessed    SENSATION: WFL  EDEMA:  Intermittent edema  MUSCLE LENGTH: Hamstrings: very mild tightness B  ITB: mild/mod tight B Piriformis: mild/mod tight L>R Hip flexors: mild tight B Quads: mild tight B Heelcord: mild tight on R, mod tight on L   POSTURE:  B pes planus  LOWER EXTREMITY ROM:  Active ROM Right eval Left eval Left 06/03/23  Ankle dorsiflexion 15 3 4   Ankle plantarflexion 45 46 60  Ankle inversion 36 24 28  Ankle eversion 25 18 32   Passive ROM Left eval  Ankle dorsiflexion 9   Ankle plantarflexion   Ankle inversion   Ankle eversion   (Blank rows = not tested)  LOWER EXTREMITY MMT:  MMT Right eval Left eval  Hip flexion 5 5  Hip extension 5 4+  Hip abduction 4+ 4+  Hip adduction 4+ 4+  Hip internal rotation 5 4+  Hip external rotation 4+ 4  Knee flexion 5 5  Knee extension 5 4+  Ankle dorsiflexion 5 4+  Ankle plantarflexion 5 4 (12 SLS HR)  Ankle inversion 5 4-  Ankle eversion 5 4-   (Blank rows = not tested)  LOWER EXTREMITY SPECIAL TESTS:  Hip special tests: Ober's test: positive  - bilateral  FUNCTIONAL TESTS:  SLS - R = 16.91 sec, L = 5.97 sec   TODAY'S TREATMENT:  06/19/23 THERAPEUTIC EXERCISE: to improve flexibility, strength and mobility.  Verbal and tactile cues throughout for technique.  Bike L3 x 6 min L SLS x 30 sec; 15 second hold w/o UE support - reports feeling in medial foot on L Standing arch sets B 5 sec hold x 5 Clock balance with RLE + L SLS 5x 1/2 circle Marches (high knees) from airex x 10 Bil Tandem stance on airex B x 20 sec Tandem walking fwd/bwd x 10 ft ea Gastroc stretch L 2 x 20 sec   MANUAL THERAPY: to decrease muscle spasm and pain and improve mobility Deep STM and IASTM  to L peroneals and lateral gastroc in S/L and prone.   06/10/23 THERAPEUTIC EXERCISE: to improve flexibility, strength and mobility.  Verbal and tactile cues throughout for technique.  Bike L3 x 6 min L SLS x 30 sec; 15 second hold w/o UE support - reports feeling in medial foot on L Standing arch sets B 5 sec hold x 5 Clock balance with RLE + L SLS 5x 1/2 circle Marches (high knees) from airex x 10 Bil Tandem stance on airex B x 20 sec Tandem walking fwd/bwd x 10 ft ea Gastroc stretch L 2 x  20 sec   MANUAL THERAPY: to decrease muscle spasm and pain and improve mobility Deep STM and IASTM  to L peroneals and lateral gastroc in S/L and prone.   SELF CARE: educated on use of rolling pin or hand held roller for STM. Additionally educated  pt on role of DN for future visit.    06/03/23 THERAPEUTIC EXERCISE: to improve flexibility, strength and mobility.  Verbal and tactile cues throughout for technique.  Nustep L5x17min Ankle ROM assessed L SLS x 30 sec; 15 second hold w/o UE support Clock balance with RLE + L SLS 5x 1/2 circle Marches from airex x 10 Bil  Manual Therapy: to decrease muscle spasm, pain and improve mobility STM to L peroneal muscles and tibialis anterior  05/22/23 THERAPEUTIC EXERCISE: to improve flexibility, strength and mobility.  Verbal and tactile cues throughout for technique.  Elliptical - L2.0 x 6 min Seated GTB L 4-way ankle x 10 each direction Standing GTB 4-way hip SLR x 10 each, 1 pole A for balance L SLS + R LE clocks x 5 B concentric/L eccentric heel raises x 10   05/15/23 Initial eval  THERAPEUTIC EXERCISE: to improve flexibility, strength and mobility.  Verbal and tactile cues throughout for technique.  Review of standing gastroc and soleus stretches   PATIENT EDUCATION:  Education details: HEP review, HEP update, and HEP progression  Person educated: Patient Education method: Explanation, Demonstration, Verbal cues, and MedBridgeGO updated Education comprehension: verbalized understanding and needs further education  HOME EXERCISE PROGRAM: *Access Code: [email or web address] URL: [email or web address] Date: 05/22/2023 Prepared by: Glenetta Hew  Exercises - Standing Gastroc Stretch at Counter  - 2 x daily - 7 x weekly - 2 sets - 30 sec hold - Standing Soleus Stretch at Counter  - 2 x daily - 7 x weekly - 3 reps - 30 sec hold - Supine Ankle Circles  - 2-3 x daily - 7 x weekly - 2 sets - 10 reps - Seated Ankle Plantar Flexion with Resistance Loop  - 1 x daily - 3-4 x weekly - 2 sets - 10 reps - 3 sec hold - Seated Ankle Dorsiflexion with Resistance  - 1 x daily - 3-4 x weekly - 2 sets - 10 reps - 3 sec hold - Seated Ankle Eversion with Resistance  - 1 x daily - 3-4 x weekly - 2  sets - 10 reps - 3 sec hold - Seated Ankle Inversion with Resistance  - 1 x daily - 73-4 x weekly - 2 sets - 10 reps - 3 sec hold - [place]  - 1 x daily - 7 x weekly - 3 sets - 10 reps - Retro Step  - 1 x daily - 7 x weekly - 3 sets - 10 reps - Standing Hip Flexion with Anchored Resistance and Chair Support  - 1 x daily - 3-4 x weekly - 2 sets - 10 reps - 3 sec hold - Standing Hip Adduction with Anchored Resistance  - 1 x daily - 3-4 x weekly - 2 sets - 10 reps - 3 sec hold - Standing Hip Extension with Anchored Resistance  - 1 x daily - 3-4 x weekly - 2 sets - 10 reps - 3 sec hold - Standing Hip Abduction with Anchored Resistance  - 1 x daily - 3-4 x weekly - 2 sets - 10 reps - 3 sec hold - Single Leg Balance with Opposite Leg Star Reach  - 1 x daily - 7 x weekly -  2 sets - 10 reps - 3 sec hold - Standing Eccentric Heel Raise (Mirrored)  - 1 x daily - 3-4 x weekly - 2 sets - 10 reps - 3 sec hold  Patient Education - Scar Massage - Rubbing with Different Textures - Contrast Bath  *[email or web address] access code texted to patient   ASSESSMENT:  CLINICAL IMPRESSION: ***   OBJECTIVE IMPAIRMENTS: decreased activity tolerance, difficulty walking, decreased ROM, decreased strength, hypomobility, increased edema, increased fascial restrictions, impaired perceived functional ability, increased muscle spasms, impaired flexibility, improper body mechanics, postural dysfunction, and pain.   ACTIVITY LIMITATIONS: standing, squatting, stairs, and locomotion level  PARTICIPATION LIMITATIONS: cleaning, shopping, community activity, occupation, and yard work  PERSONAL FACTORS: Past/current experiences, Time since onset of injury/illness/exacerbation, and 3+ comorbidities: 05/25/2022 - ORIF left lateral malleolus ankle fracture (05/20/22); HTN, migraines, GERD, LBP  are also affecting patient's functional outcome.   REHAB POTENTIAL: Good  CLINICAL DECISION MAKING: Stable/uncomplicated  EVALUATION  COMPLEXITY: Low   GOALS: Goals reviewed with patient? Yes  SHORT TERM GOALS: Target date: 06/05/2023  Patient will be independent with initial HEP. Baseline:  Goal status: MET- 06/03/23  LONG TERM GOALS: Target date: 06/26/2023  Patient will be independent with advanced/ongoing HEP to improve outcomes and carryover.  Baseline:  Goal status: IN PROGRESS  2.  Patient will report at least 50-75% improvement in L ankle/foot pain to improve QOL. Baseline: 4/10 Goal status: IN PROGRESS  3.  Patient will demonstrate improved L ankle DF AROM to [facility] to allow for normal gait and stair mechanics. Baseline: 3 Goal status: IN PROGRESS  4.  Patient will demonstrate improved L LE strength to >/= 4+/5 for improved stability and ease of mobility. Baseline: Refer to above MMT table Goal status: IN PROGRESS  5.  Patient will be able to increase walking tolerance to >/= 6-8K steps w/o limitation due to L ankle pain.  Baseline: 3.5K steps on average Goal status: IN PROGRESS  6. Patient will be able to ascend/descend stairs with 1 HR and reciprocal step pattern safely to access home and community.  Baseline:  Goal status: IN PROGRESS  7.  Patient will report >/= 63/80 on LEFS to demonstrate improved functional ability. Baseline: 54 / 80 = 67.5 % Goal status: IN PROGRESS  8.  Patient will be able to maintain L SLS for >/= 15 sec for improved ankle stability. Baseline: 5.97 sec Goal status: IN PROGRESS    PLAN:  PT FREQUENCY: 2x/week  PT DURATION: 4-6 weeks  PLANNED INTERVENTIONS: Therapeutic exercises, Therapeutic activity, Neuromuscular re-education, Balance training, Gait training, Patient/Family education, Self Care, Joint mobilization, Stair training, Aquatic Therapy, Dry Needling, Electrical stimulation, Cryotherapy, Moist heat, Taping, Vasopneumatic device, Ultrasound, Ionotophoresis 4mg /ml Dexamethasone, Manual therapy, and Re-evaluation  PLAN FOR NEXT SESSION:  progress L ankle  ROM and strengthening; balance and proprioceptive training  Solon Palm, PT 06/10/2023, 2:58 PM

## 2023-06-19 ENCOUNTER — Encounter: Payer: Self-pay | Admitting: Physical Therapy

## 2023-06-19 ENCOUNTER — Ambulatory Visit: Payer: 59 | Admitting: Physical Therapy

## 2023-06-19 DIAGNOSIS — M25672 Stiffness of left ankle, not elsewhere classified: Secondary | ICD-10-CM

## 2023-06-19 DIAGNOSIS — R29898 Other symptoms and signs involving the musculoskeletal system: Secondary | ICD-10-CM

## 2023-06-19 DIAGNOSIS — R262 Difficulty in walking, not elsewhere classified: Secondary | ICD-10-CM

## 2023-06-19 DIAGNOSIS — M25572 Pain in left ankle and joints of left foot: Secondary | ICD-10-CM

## 2023-06-19 DIAGNOSIS — R2689 Other abnormalities of gait and mobility: Secondary | ICD-10-CM

## 2023-06-19 DIAGNOSIS — R2681 Unsteadiness on feet: Secondary | ICD-10-CM
